# Patient Record
Sex: Female | Born: 1955 | Race: White | Hispanic: No | Marital: Married | Smoking: Former smoker
Health system: Southern US, Community
[De-identification: ages and names within clinical notes are randomized; demographics above are authoritative.]

## PROBLEM LIST (undated history)

## (undated) DIAGNOSIS — M899 Disorder of bone, unspecified: Secondary | ICD-10-CM

## (undated) DIAGNOSIS — J45909 Unspecified asthma, uncomplicated: Secondary | ICD-10-CM

## (undated) DIAGNOSIS — M84376A Stress fracture, unspecified foot, initial encounter for fracture: Secondary | ICD-10-CM

## (undated) DIAGNOSIS — M949 Disorder of cartilage, unspecified: Secondary | ICD-10-CM

## (undated) DIAGNOSIS — F329 Major depressive disorder, single episode, unspecified: Secondary | ICD-10-CM

## (undated) DIAGNOSIS — D8989 Other specified disorders involving the immune mechanism, not elsewhere classified: Secondary | ICD-10-CM

## (undated) DIAGNOSIS — E663 Overweight: Secondary | ICD-10-CM

## (undated) DIAGNOSIS — M810 Age-related osteoporosis without current pathological fracture: Secondary | ICD-10-CM

## (undated) DIAGNOSIS — F411 Generalized anxiety disorder: Secondary | ICD-10-CM

## (undated) DIAGNOSIS — C801 Malignant (primary) neoplasm, unspecified: Secondary | ICD-10-CM

## (undated) DIAGNOSIS — I10 Essential (primary) hypertension: Secondary | ICD-10-CM

## (undated) DIAGNOSIS — I498 Other specified cardiac arrhythmias: Secondary | ICD-10-CM

## (undated) DIAGNOSIS — H409 Unspecified glaucoma: Secondary | ICD-10-CM

## (undated) DIAGNOSIS — M25519 Pain in unspecified shoulder: Secondary | ICD-10-CM

## (undated) DIAGNOSIS — E7800 Pure hypercholesterolemia, unspecified: Secondary | ICD-10-CM

## (undated) DIAGNOSIS — K219 Gastro-esophageal reflux disease without esophagitis: Secondary | ICD-10-CM

## (undated) DIAGNOSIS — T7840XA Allergy, unspecified, initial encounter: Secondary | ICD-10-CM

## (undated) DIAGNOSIS — F3289 Other specified depressive episodes: Secondary | ICD-10-CM

## (undated) DIAGNOSIS — H269 Unspecified cataract: Secondary | ICD-10-CM

## (undated) DIAGNOSIS — K589 Irritable bowel syndrome without diarrhea: Secondary | ICD-10-CM

## (undated) HISTORY — DX: Other specified depressive episodes: F32.89

## (undated) HISTORY — DX: Gastro-esophageal reflux disease without esophagitis: K21.9

## (undated) HISTORY — DX: Age-related osteoporosis without current pathological fracture: M81.0

## (undated) HISTORY — PX: EYE SURGERY: SHX253

## (undated) HISTORY — DX: Other specified disorders involving the immune mechanism, not elsewhere classified: D89.89

## (undated) HISTORY — DX: Overweight: E66.3

## (undated) HISTORY — DX: Unspecified asthma, uncomplicated: J45.909

## (undated) HISTORY — DX: Pain in unspecified shoulder: M25.519

## (undated) HISTORY — DX: Stress fracture, unspecified foot, initial encounter for fracture: M84.376A

## (undated) HISTORY — DX: Unspecified glaucoma: H40.9

## (undated) HISTORY — DX: Pure hypercholesterolemia, unspecified: E78.00

## (undated) HISTORY — DX: Allergy, unspecified, initial encounter: T78.40XA

## (undated) HISTORY — DX: Disorder of bone, unspecified: M89.9

## (undated) HISTORY — DX: Unspecified cataract: H26.9

## (undated) HISTORY — DX: Disorder of cartilage, unspecified: M94.9

## (undated) HISTORY — DX: Other specified cardiac arrhythmias: I49.8

## (undated) HISTORY — DX: Essential (primary) hypertension: I10

## (undated) HISTORY — DX: Generalized anxiety disorder: F41.1

## (undated) HISTORY — DX: Major depressive disorder, single episode, unspecified: F32.9

## (undated) HISTORY — DX: Irritable bowel syndrome, unspecified: K58.9

## (undated) HISTORY — PX: COLONOSCOPY: SHX174

## (undated) HISTORY — DX: Malignant (primary) neoplasm, unspecified: C80.1

---

## 1978-05-11 HISTORY — PX: DILATION AND CURETTAGE OF UTERUS: SHX78

## 1997-06-20 ENCOUNTER — Ambulatory Visit (HOSPITAL_COMMUNITY): Admission: RE | Admit: 1997-06-20 | Discharge: 1997-06-20 | Payer: Self-pay | Admitting: Obstetrics and Gynecology

## 1997-07-25 ENCOUNTER — Ambulatory Visit (HOSPITAL_COMMUNITY): Admission: RE | Admit: 1997-07-25 | Discharge: 1997-07-25 | Payer: Self-pay | Admitting: *Deleted

## 1998-03-21 ENCOUNTER — Other Ambulatory Visit: Admission: RE | Admit: 1998-03-21 | Discharge: 1998-03-21 | Payer: Self-pay | Admitting: Obstetrics & Gynecology

## 1998-08-08 ENCOUNTER — Ambulatory Visit (HOSPITAL_COMMUNITY): Admission: RE | Admit: 1998-08-08 | Discharge: 1998-08-08 | Payer: Self-pay | Admitting: Obstetrics and Gynecology

## 1998-08-22 ENCOUNTER — Ambulatory Visit (HOSPITAL_COMMUNITY): Admission: RE | Admit: 1998-08-22 | Discharge: 1998-08-22 | Payer: Self-pay | Admitting: Obstetrics and Gynecology

## 1998-08-22 ENCOUNTER — Encounter: Payer: Self-pay | Admitting: Obstetrics and Gynecology

## 1999-04-10 ENCOUNTER — Other Ambulatory Visit: Admission: RE | Admit: 1999-04-10 | Discharge: 1999-04-10 | Payer: Self-pay | Admitting: Obstetrics and Gynecology

## 1999-09-03 ENCOUNTER — Encounter: Payer: Self-pay | Admitting: Obstetrics & Gynecology

## 1999-09-03 ENCOUNTER — Encounter: Admission: RE | Admit: 1999-09-03 | Discharge: 1999-09-03 | Payer: Self-pay | Admitting: Obstetrics & Gynecology

## 2000-05-17 ENCOUNTER — Other Ambulatory Visit: Admission: RE | Admit: 2000-05-17 | Discharge: 2000-05-17 | Payer: Self-pay | Admitting: Obstetrics and Gynecology

## 2000-09-30 ENCOUNTER — Encounter: Payer: Self-pay | Admitting: Obstetrics and Gynecology

## 2000-09-30 ENCOUNTER — Ambulatory Visit (HOSPITAL_COMMUNITY): Admission: RE | Admit: 2000-09-30 | Discharge: 2000-09-30 | Payer: Self-pay | Admitting: Obstetrics and Gynecology

## 2001-10-04 ENCOUNTER — Ambulatory Visit (HOSPITAL_COMMUNITY): Admission: RE | Admit: 2001-10-04 | Discharge: 2001-10-04 | Payer: Self-pay | Admitting: Obstetrics and Gynecology

## 2001-10-04 ENCOUNTER — Encounter: Payer: Self-pay | Admitting: Obstetrics and Gynecology

## 2002-04-07 ENCOUNTER — Encounter: Payer: Self-pay | Admitting: Pulmonary Disease

## 2002-04-07 ENCOUNTER — Encounter: Admission: RE | Admit: 2002-04-07 | Discharge: 2002-04-07 | Payer: Self-pay | Admitting: Pulmonary Disease

## 2002-04-28 ENCOUNTER — Encounter: Admission: RE | Admit: 2002-04-28 | Discharge: 2002-04-28 | Payer: Self-pay | Admitting: Pulmonary Disease

## 2002-04-28 ENCOUNTER — Encounter: Payer: Self-pay | Admitting: Pulmonary Disease

## 2003-04-13 ENCOUNTER — Encounter: Admission: RE | Admit: 2003-04-13 | Discharge: 2003-04-13 | Payer: Self-pay | Admitting: Obstetrics and Gynecology

## 2003-05-12 HISTORY — PX: CERVICAL POLYPECTOMY: SHX88

## 2004-03-19 ENCOUNTER — Ambulatory Visit: Payer: Self-pay | Admitting: Pulmonary Disease

## 2004-03-24 ENCOUNTER — Ambulatory Visit: Payer: Self-pay | Admitting: Pulmonary Disease

## 2004-04-16 ENCOUNTER — Other Ambulatory Visit: Admission: RE | Admit: 2004-04-16 | Discharge: 2004-04-16 | Payer: Self-pay | Admitting: Obstetrics and Gynecology

## 2004-04-23 ENCOUNTER — Ambulatory Visit: Payer: Self-pay | Admitting: Pulmonary Disease

## 2004-04-23 ENCOUNTER — Ambulatory Visit (HOSPITAL_COMMUNITY): Admission: RE | Admit: 2004-04-23 | Discharge: 2004-04-23 | Payer: Self-pay | Admitting: Pulmonary Disease

## 2004-05-01 ENCOUNTER — Encounter: Admission: RE | Admit: 2004-05-01 | Discharge: 2004-05-01 | Payer: Self-pay | Admitting: Obstetrics and Gynecology

## 2004-06-12 ENCOUNTER — Ambulatory Visit: Payer: Self-pay | Admitting: Pulmonary Disease

## 2004-08-26 ENCOUNTER — Ambulatory Visit: Payer: Self-pay | Admitting: Pulmonary Disease

## 2004-08-29 ENCOUNTER — Ambulatory Visit (HOSPITAL_COMMUNITY): Admission: RE | Admit: 2004-08-29 | Discharge: 2004-08-29 | Payer: Self-pay | Admitting: Pulmonary Disease

## 2004-10-31 ENCOUNTER — Ambulatory Visit: Payer: Self-pay | Admitting: Pulmonary Disease

## 2005-02-27 ENCOUNTER — Emergency Department (HOSPITAL_COMMUNITY): Admission: EM | Admit: 2005-02-27 | Discharge: 2005-02-27 | Payer: Self-pay | Admitting: Emergency Medicine

## 2005-03-02 ENCOUNTER — Ambulatory Visit: Payer: Self-pay | Admitting: Pulmonary Disease

## 2005-03-04 ENCOUNTER — Ambulatory Visit: Payer: Self-pay | Admitting: Pulmonary Disease

## 2005-03-18 ENCOUNTER — Ambulatory Visit: Payer: Self-pay

## 2005-03-18 ENCOUNTER — Ambulatory Visit: Payer: Self-pay | Admitting: Cardiology

## 2005-03-23 ENCOUNTER — Ambulatory Visit: Payer: Self-pay | Admitting: Pulmonary Disease

## 2005-04-28 ENCOUNTER — Ambulatory Visit: Payer: Self-pay | Admitting: Pulmonary Disease

## 2005-05-13 ENCOUNTER — Ambulatory Visit: Payer: Self-pay | Admitting: Pulmonary Disease

## 2005-05-21 ENCOUNTER — Other Ambulatory Visit: Admission: RE | Admit: 2005-05-21 | Discharge: 2005-05-21 | Payer: Self-pay | Admitting: Obstetrics and Gynecology

## 2005-05-22 ENCOUNTER — Ambulatory Visit: Payer: Self-pay | Admitting: Pulmonary Disease

## 2005-05-28 ENCOUNTER — Encounter: Admission: RE | Admit: 2005-05-28 | Discharge: 2005-05-28 | Payer: Self-pay | Admitting: Obstetrics and Gynecology

## 2005-09-01 ENCOUNTER — Ambulatory Visit: Payer: Self-pay | Admitting: Pulmonary Disease

## 2005-10-23 ENCOUNTER — Encounter: Admission: RE | Admit: 2005-10-23 | Discharge: 2005-10-23 | Payer: Self-pay | Admitting: Gastroenterology

## 2005-11-12 ENCOUNTER — Ambulatory Visit: Payer: Self-pay | Admitting: Pulmonary Disease

## 2005-12-13 ENCOUNTER — Emergency Department (HOSPITAL_COMMUNITY): Admission: EM | Admit: 2005-12-13 | Discharge: 2005-12-13 | Payer: Self-pay | Admitting: Emergency Medicine

## 2006-03-17 ENCOUNTER — Ambulatory Visit: Payer: Self-pay | Admitting: Cardiology

## 2006-06-01 ENCOUNTER — Encounter: Admission: RE | Admit: 2006-06-01 | Discharge: 2006-06-01 | Payer: Self-pay | Admitting: Obstetrics & Gynecology

## 2006-07-07 ENCOUNTER — Ambulatory Visit: Payer: Self-pay | Admitting: Pulmonary Disease

## 2006-07-07 LAB — CONVERTED CEMR LAB
ALT: 24 units/L (ref 0–40)
AST: 19 units/L (ref 0–37)
Albumin: 3.7 g/dL (ref 3.5–5.2)
Alkaline Phosphatase: 73 units/L (ref 39–117)
BUN: 9 mg/dL (ref 6–23)
Basophils Absolute: 0 10*3/uL (ref 0.0–0.1)
Basophils Relative: 0.1 % (ref 0.0–1.0)
Bilirubin Urine: NEGATIVE
Bilirubin, Direct: 0.1 mg/dL (ref 0.0–0.3)
CO2: 26 meq/L (ref 19–32)
Calcium: 9.1 mg/dL (ref 8.4–10.5)
Chloride: 105 meq/L (ref 96–112)
Creatinine, Ser: 0.6 mg/dL (ref 0.4–1.2)
Crystals: NEGATIVE
Eosinophils Absolute: 0.2 10*3/uL (ref 0.0–0.6)
Eosinophils Relative: 1.6 % (ref 0.0–5.0)
GFR calc Af Amer: 136 mL/min
GFR calc non Af Amer: 112 mL/min
Glucose, Bld: 97 mg/dL (ref 70–99)
HCT: 39.2 % (ref 36.0–46.0)
Hemoglobin: 13.3 g/dL (ref 12.0–15.0)
Hgb A1c MFr Bld: 5.8 % (ref 4.6–6.0)
Ketones, ur: NEGATIVE mg/dL
Lymphocytes Relative: 8.6 % — ABNORMAL LOW (ref 12.0–46.0)
MCHC: 33.8 g/dL (ref 30.0–36.0)
MCV: 85.8 fL (ref 78.0–100.0)
Monocytes Absolute: 0.7 10*3/uL (ref 0.2–0.7)
Monocytes Relative: 6.2 % (ref 3.0–11.0)
Mucus, UA: NEGATIVE
Neutro Abs: 8.7 10*3/uL — ABNORMAL HIGH (ref 1.4–7.7)
Neutrophils Relative %: 83.5 % — ABNORMAL HIGH (ref 43.0–77.0)
Nitrite: NEGATIVE
Platelets: 346 10*3/uL (ref 150–400)
Potassium: 3.7 meq/L (ref 3.5–5.1)
RBC / HPF: NONE SEEN
RBC: 4.57 M/uL (ref 3.87–5.11)
RDW: 16.9 % — ABNORMAL HIGH (ref 11.5–14.6)
Sodium: 140 meq/L (ref 135–145)
Specific Gravity, Urine: 1.015 (ref 1.000–1.03)
TSH: 1.28 microintl units/mL (ref 0.35–5.50)
Total Bilirubin: 0.7 mg/dL (ref 0.3–1.2)
Total Protein, Urine: 30 mg/dL — AB
Total Protein: 6.7 g/dL (ref 6.0–8.3)
Urine Glucose: NEGATIVE mg/dL
Urobilinogen, UA: 0.2 (ref 0.0–1.0)
WBC: 10.5 10*3/uL (ref 4.5–10.5)
pH: 6.5 (ref 5.0–8.0)

## 2006-07-12 ENCOUNTER — Ambulatory Visit: Payer: Self-pay | Admitting: Pulmonary Disease

## 2006-07-12 LAB — CONVERTED CEMR LAB
Bilirubin Urine: NEGATIVE
Crystals: NEGATIVE
Ketones, ur: NEGATIVE mg/dL
Mucus, UA: NEGATIVE
Nitrite: NEGATIVE
Specific Gravity, Urine: <=1.005 (ref 1.000–1.03)
Total Protein, Urine: NEGATIVE mg/dL
Urine Glucose: NEGATIVE mg/dL
Urobilinogen, UA: 0.2 (ref 0.0–1.0)
pH: 6.5 (ref 5.0–8.0)

## 2006-07-21 ENCOUNTER — Ambulatory Visit: Payer: Self-pay | Admitting: Pulmonary Disease

## 2006-07-21 LAB — CONVERTED CEMR LAB
Bilirubin Urine: NEGATIVE
Hemoglobin, Urine: NEGATIVE
Ketones, ur: NEGATIVE mg/dL
Leukocytes, UA: NEGATIVE
Nitrite: NEGATIVE
Specific Gravity, Urine: 1.015 (ref 1.000–1.03)
Total Protein, Urine: NEGATIVE mg/dL
Urine Glucose: NEGATIVE mg/dL
Urobilinogen, UA: 0.2 (ref 0.0–1.0)
pH: 7 (ref 5.0–8.0)

## 2006-11-16 ENCOUNTER — Ambulatory Visit: Payer: Self-pay | Admitting: Pulmonary Disease

## 2006-12-14 ENCOUNTER — Ambulatory Visit: Payer: Self-pay | Admitting: Pulmonary Disease

## 2006-12-14 LAB — CONVERTED CEMR LAB
ALT: 32 units/L (ref 0–35)
AST: 24 units/L (ref 0–37)
Albumin: 3.7 g/dL (ref 3.5–5.2)
Alkaline Phosphatase: 63 units/L (ref 39–117)
BUN: 9 mg/dL (ref 6–23)
Bilirubin, Direct: 0.1 mg/dL (ref 0.0–0.3)
CO2: 30 meq/L (ref 19–32)
Calcium: 9.1 mg/dL (ref 8.4–10.5)
Chloride: 106 meq/L (ref 96–112)
Cholesterol: 264 mg/dL (ref 0–200)
Creatinine, Ser: 0.7 mg/dL (ref 0.4–1.2)
Direct LDL: 187.3 mg/dL
GFR calc Af Amer: 113 mL/min
GFR calc non Af Amer: 94 mL/min
Glucose, Bld: 92 mg/dL (ref 70–99)
HDL: 68.7 mg/dL (ref >39.0)
Potassium: 3.5 meq/L (ref 3.5–5.1)
Sodium: 142 meq/L (ref 135–145)
Total Bilirubin: 0.7 mg/dL (ref 0.3–1.2)
Total CHOL/HDL Ratio: 3.8
Total Protein: 6.6 g/dL (ref 6.0–8.3)
Triglycerides: 93 mg/dL (ref 0–149)
VLDL: 19 mg/dL (ref 0–40)

## 2006-12-15 ENCOUNTER — Ambulatory Visit: Payer: Self-pay | Admitting: Pulmonary Disease

## 2007-03-29 ENCOUNTER — Ambulatory Visit: Payer: Self-pay | Admitting: Cardiology

## 2007-04-14 ENCOUNTER — Ambulatory Visit: Payer: Self-pay | Admitting: Pulmonary Disease

## 2007-04-14 LAB — CONVERTED CEMR LAB
ALT: 20 units/L (ref 0–35)
AST: 19 units/L (ref 0–37)
Albumin: 3.5 g/dL (ref 3.5–5.2)
Alkaline Phosphatase: 61 units/L (ref 39–117)
Bilirubin, Direct: 0.1 mg/dL (ref 0.0–0.3)
Cholesterol: 208 mg/dL (ref 0–200)
Direct LDL: 139 mg/dL
HDL: 61 mg/dL (ref >39.0)
Total Bilirubin: 0.6 mg/dL (ref 0.3–1.2)
Total CHOL/HDL Ratio: 3.4
Total Protein: 6.5 g/dL (ref 6.0–8.3)
Triglycerides: 78 mg/dL (ref 0–149)
VLDL: 16 mg/dL (ref 0–40)

## 2007-04-26 DIAGNOSIS — F329 Major depressive disorder, single episode, unspecified: Secondary | ICD-10-CM

## 2007-04-26 DIAGNOSIS — J45909 Unspecified asthma, uncomplicated: Secondary | ICD-10-CM

## 2007-04-26 DIAGNOSIS — I10 Essential (primary) hypertension: Secondary | ICD-10-CM | POA: Insufficient documentation

## 2007-04-26 DIAGNOSIS — F411 Generalized anxiety disorder: Secondary | ICD-10-CM

## 2007-05-13 ENCOUNTER — Telehealth (INDEPENDENT_AMBULATORY_CARE_PROVIDER_SITE_OTHER): Payer: Self-pay | Admitting: *Deleted

## 2007-06-20 ENCOUNTER — Encounter: Payer: Self-pay | Admitting: Pulmonary Disease

## 2007-07-25 ENCOUNTER — Encounter: Admission: RE | Admit: 2007-07-25 | Discharge: 2007-07-25 | Payer: Self-pay | Admitting: Unknown Physician Specialty

## 2008-02-27 ENCOUNTER — Telehealth (INDEPENDENT_AMBULATORY_CARE_PROVIDER_SITE_OTHER): Payer: Self-pay | Admitting: *Deleted

## 2008-05-16 ENCOUNTER — Ambulatory Visit: Payer: Self-pay | Admitting: Pulmonary Disease

## 2008-05-16 DIAGNOSIS — R109 Unspecified abdominal pain: Secondary | ICD-10-CM

## 2008-07-17 ENCOUNTER — Ambulatory Visit: Payer: Self-pay | Admitting: Cardiology

## 2008-08-18 DIAGNOSIS — I498 Other specified cardiac arrhythmias: Secondary | ICD-10-CM

## 2008-09-05 ENCOUNTER — Encounter: Admission: RE | Admit: 2008-09-05 | Discharge: 2008-09-05 | Payer: Self-pay | Admitting: Obstetrics & Gynecology

## 2008-11-19 ENCOUNTER — Telehealth: Payer: Self-pay | Admitting: Cardiology

## 2008-11-22 ENCOUNTER — Telehealth: Payer: Self-pay | Admitting: Pulmonary Disease

## 2008-11-23 ENCOUNTER — Ambulatory Visit: Payer: Self-pay | Admitting: Pulmonary Disease

## 2008-11-23 ENCOUNTER — Telehealth (INDEPENDENT_AMBULATORY_CARE_PROVIDER_SITE_OTHER): Payer: Self-pay | Admitting: *Deleted

## 2008-11-26 ENCOUNTER — Ambulatory Visit: Payer: Self-pay | Admitting: Adult Health

## 2008-11-26 ENCOUNTER — Ambulatory Visit: Payer: Self-pay | Admitting: Pulmonary Disease

## 2008-11-26 DIAGNOSIS — R519 Headache, unspecified: Secondary | ICD-10-CM | POA: Insufficient documentation

## 2008-11-26 DIAGNOSIS — M542 Cervicalgia: Secondary | ICD-10-CM | POA: Insufficient documentation

## 2008-11-26 DIAGNOSIS — R51 Headache: Secondary | ICD-10-CM

## 2008-11-26 LAB — CONVERTED CEMR LAB
ALT: 23 units/L (ref 0–35)
AST: 19 units/L (ref 0–37)
Albumin: 3.8 g/dL (ref 3.5–5.2)
Alkaline Phosphatase: 65 units/L (ref 39–117)
BUN: 13 mg/dL (ref 6–23)
Basophils Absolute: 0 10*3/uL (ref 0.0–0.1)
Basophils Relative: 0.1 % (ref 0.0–3.0)
Bilirubin, Direct: 0.1 mg/dL (ref 0.0–0.3)
CO2: 32 meq/L (ref 19–32)
Calcium: 9.1 mg/dL (ref 8.4–10.5)
Chloride: 103 meq/L (ref 96–112)
Cholesterol: 260 mg/dL — ABNORMAL HIGH (ref 0–200)
Creatinine, Ser: 0.7 mg/dL (ref 0.4–1.2)
Direct LDL: 155.5 mg/dL
Eosinophils Absolute: 0 10*3/uL (ref 0.0–0.7)
Eosinophils Relative: 0.1 % (ref 0.0–5.0)
GFR calc non Af Amer: 92.75 mL/min (ref >60)
Glucose, Bld: 99 mg/dL (ref 70–99)
HCT: 39.7 % (ref 36.0–46.0)
HDL: 96.3 mg/dL (ref >39.00)
Hemoglobin: 13.8 g/dL (ref 12.0–15.0)
Lymphocytes Relative: 11 % — ABNORMAL LOW (ref 12.0–46.0)
Lymphs Abs: 1.1 10*3/uL (ref 0.7–4.0)
MCHC: 34.8 g/dL (ref 30.0–36.0)
MCV: 91.9 fL (ref 78.0–100.0)
Monocytes Absolute: 0.5 10*3/uL (ref 0.1–1.0)
Monocytes Relative: 4.5 % (ref 3.0–12.0)
Neutro Abs: 8.7 10*3/uL — ABNORMAL HIGH (ref 1.4–7.7)
Neutrophils Relative %: 84.3 % — ABNORMAL HIGH (ref 43.0–77.0)
Platelets: 360 10*3/uL (ref 150.0–400.0)
Potassium: 4 meq/L (ref 3.5–5.1)
RBC: 4.33 M/uL (ref 3.87–5.11)
RDW: 13.7 % (ref 11.5–14.6)
Sodium: 143 meq/L (ref 135–145)
TSH: 0.42 microintl units/mL (ref 0.35–5.50)
Total Bilirubin: 0.6 mg/dL (ref 0.3–1.2)
Total CHOL/HDL Ratio: 3
Total Protein: 7.1 g/dL (ref 6.0–8.3)
Triglycerides: 85 mg/dL (ref 0.0–149.0)
VLDL: 17 mg/dL (ref 0.0–40.0)
WBC: 10.3 10*3/uL (ref 4.5–10.5)

## 2008-11-27 ENCOUNTER — Ambulatory Visit: Payer: Self-pay | Admitting: Cardiology

## 2008-11-30 LAB — CONVERTED CEMR LAB: Vit D, 25-Hydroxy: 33 ng/mL (ref 30–89)

## 2008-12-12 ENCOUNTER — Encounter: Payer: Self-pay | Admitting: Adult Health

## 2008-12-12 ENCOUNTER — Ambulatory Visit: Payer: Self-pay | Admitting: Pulmonary Disease

## 2009-01-01 ENCOUNTER — Telehealth (INDEPENDENT_AMBULATORY_CARE_PROVIDER_SITE_OTHER): Payer: Self-pay | Admitting: *Deleted

## 2009-03-25 ENCOUNTER — Ambulatory Visit: Payer: Self-pay | Admitting: Pulmonary Disease

## 2009-03-25 DIAGNOSIS — E663 Overweight: Secondary | ICD-10-CM

## 2009-03-25 DIAGNOSIS — K589 Irritable bowel syndrome without diarrhea: Secondary | ICD-10-CM

## 2009-03-25 DIAGNOSIS — M84376A Stress fracture, unspecified foot, initial encounter for fracture: Secondary | ICD-10-CM | POA: Insufficient documentation

## 2009-03-25 DIAGNOSIS — K219 Gastro-esophageal reflux disease without esophagitis: Secondary | ICD-10-CM | POA: Insufficient documentation

## 2009-03-25 DIAGNOSIS — E7800 Pure hypercholesterolemia, unspecified: Secondary | ICD-10-CM

## 2009-03-25 DIAGNOSIS — M25519 Pain in unspecified shoulder: Secondary | ICD-10-CM

## 2009-03-27 ENCOUNTER — Ambulatory Visit: Payer: Self-pay | Admitting: Pulmonary Disease

## 2009-03-31 LAB — CONVERTED CEMR LAB
Cholesterol: 208 mg/dL — ABNORMAL HIGH (ref 0–200)
Direct LDL: 129.8 mg/dL
HDL: 79.9 mg/dL (ref >39.00)
Total CHOL/HDL Ratio: 3
Triglycerides: 89 mg/dL (ref 0.0–149.0)
VLDL: 17.8 mg/dL (ref 0.0–40.0)

## 2009-09-23 ENCOUNTER — Ambulatory Visit: Payer: Self-pay | Admitting: Pulmonary Disease

## 2009-09-23 DIAGNOSIS — M949 Disorder of cartilage, unspecified: Secondary | ICD-10-CM

## 2009-09-23 DIAGNOSIS — M899 Disorder of bone, unspecified: Secondary | ICD-10-CM | POA: Insufficient documentation

## 2009-09-24 ENCOUNTER — Ambulatory Visit: Payer: Self-pay | Admitting: Pulmonary Disease

## 2009-09-29 LAB — CONVERTED CEMR LAB
ALT: 27 units/L (ref 0–35)
AST: 20 units/L (ref 0–37)
Albumin: 3.7 g/dL (ref 3.5–5.2)
Alkaline Phosphatase: 68 units/L (ref 39–117)
Basophils Absolute: 0 10*3/uL (ref 0.0–0.1)
Basophils Relative: 0.7 % (ref 0.0–3.0)
Bilirubin, Direct: 0 mg/dL (ref 0.0–0.3)
CO2: 30 meq/L (ref 19–32)
Calcium: 9.1 mg/dL (ref 8.4–10.5)
Creatinine, Ser: 0.7 mg/dL (ref 0.4–1.2)
Direct LDL: 138.1 mg/dL
Eosinophils Absolute: 0.4 10*3/uL (ref 0.0–0.7)
Eosinophils Relative: 6.7 % — ABNORMAL HIGH (ref 0.0–5.0)
GFR calc non Af Amer: 100.72 mL/min (ref >60)
Glucose, Bld: 84 mg/dL (ref 70–99)
HCT: 37.8 % (ref 36.0–46.0)
HDL: 83.8 mg/dL (ref >39.00)
Hemoglobin: 13.1 g/dL (ref 12.0–15.0)
Lymphocytes Relative: 23.4 % (ref 12.0–46.0)
Lymphs Abs: 1.5 10*3/uL (ref 0.7–4.0)
MCHC: 34.7 g/dL (ref 30.0–36.0)
MCV: 93 fL (ref 78.0–100.0)
Monocytes Absolute: 0.4 10*3/uL (ref 0.1–1.0)
Neutrophils Relative %: 63.4 % (ref 43.0–77.0)
Platelets: 309 10*3/uL (ref 150.0–400.0)
Potassium: 4 meq/L (ref 3.5–5.1)
RBC: 4.07 M/uL (ref 3.87–5.11)
RDW: 14.3 % (ref 11.5–14.6)
Sodium: 142 meq/L (ref 135–145)
TSH: 1.53 microintl units/mL (ref 0.35–5.50)
Total Bilirubin: 0.3 mg/dL (ref 0.3–1.2)
Total CHOL/HDL Ratio: 3
Total Protein: 6.4 g/dL (ref 6.0–8.3)
Triglycerides: 119 mg/dL (ref 0.0–149.0)
VLDL: 23.8 mg/dL (ref 0.0–40.0)
WBC: 6.6 10*3/uL (ref 4.5–10.5)

## 2010-02-13 ENCOUNTER — Telehealth (INDEPENDENT_AMBULATORY_CARE_PROVIDER_SITE_OTHER): Payer: Self-pay | Admitting: *Deleted

## 2010-03-25 ENCOUNTER — Ambulatory Visit: Payer: Self-pay | Admitting: Pulmonary Disease

## 2010-03-26 ENCOUNTER — Ambulatory Visit: Payer: Self-pay | Admitting: Pulmonary Disease

## 2010-03-30 LAB — CONVERTED CEMR LAB
Cholesterol: 233 mg/dL — ABNORMAL HIGH (ref 0–200)
Direct LDL: 136.1 mg/dL
HDL: 88.1 mg/dL (ref >39.00)
Total CHOL/HDL Ratio: 3
Triglycerides: 128 mg/dL (ref 0.0–149.0)
VLDL: 25.6 mg/dL (ref 0.0–40.0)

## 2010-06-10 NOTE — Assessment & Plan Note (Signed)
Summary: 6 month return//mhh   CC:  6 month ROV & review of mult medical problems....  History of Present Illness: Monique Weber here for a follow up visit and CPX... she has multiple medical problems as noted below...     ~  Sep 23, 2009:  she's had a good 49mo- had some right chest wall trauma from a gate- pain & bruise, now resolved w/o residual... she has decr her Simvastatin to 40mg /d per her insurance company- problem is her last FLP wasn't at goal on 80mg /d & we discussed checking FLP on 40mg  before deciding on changing to another statin...   ~  March 26, 2010:  she's been stable over the last 49mo- no new complaints or concerns... her breathing has been doing well on Advair & Proair;  BP controlled on 4 med regimen below & no palpit etc;  Chol is not at goal on Simva40 & we discussed switch to Lipitor40mg /d;  she needs to work on weight reduction;  still under alot of stress but doing satis on Zoloft & Alpraz...     Current Problems:   ASTHMA (ICD-493.90) - on ADVAIR 250 Bid & PROAIR HFA Prn... prev on Accolate but was only taking it 1-2 per week therefore discontinued... hx asthmatic bronchitis w/o exac x yrs although incr difficulty in the spring... currently doing well- denies cough, sputum, hemoptysis, worsening dyspnea,  wheezing, chest pains, snoring, daytime hypersomnolence, etc...   ~  CXR 11/10 showed borderline cardiomeg, clear lungs, sm left diaph defect...  HYPERTENSION (ICD-401.9) - on ASA 81mg /d,  LABETOLOL 200Bid, DILTIAZEM ER 360mg /d, LISINOPRIL 40mg /d, & LASIX 20mg /d... takes meds regularly & tol well... BP = 122/74 & similar at home... denies HA, fatigue, visual changes, CP, palipit, dizziness, syncope, dyspnea, edema, etc...   ~  baseline EKG shows NSR, WNL...  ~  2DEcho 11/06 was normal- EF= 55-65% & no regional wall motion abn.  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 no recent CP, palpit,  etc...  HYPERCHOLESTEROLEMIA (ICD-272.0) - on SIMVASTATIN 40mg Qhs + FishOil...   ~  FLP 8/08 on Vytor10/40 showed TChol 264, TG 93, HDL 69, LDL 187... change to Simva80.  ~  FLP 12/08 on Simva80 showed TChol 208, TG 78, HDL 61, LDL 139... continue med, better diet.  ~  FLP 7/10 off med showed TChol 260, TG 85, HDL 96, LDL 156... rec> restart Simva80.  ~  FLP 11/10 on Simva80 showed TChol 208, TG 89, HDL 80, LDL 130... she decr to Simva40 per Insurance Co.  ~  FLP 5/11 on Simva40 showed TChol 227, TG 119, HDL 84, LDL 138  ~  FLP 11/11 on Simva40 showed TChol 233, TG 128, HDL 88, LDL 136... rec> ch to LIPITOR 40mg /d.  OVERWEIGHT (ICD-278.02) - we reviewed diet + exercise program necessary to lose weight.  ~  weight 8/08 = 185#  ~  weight 7/10 = 201#  ~  weight 11/10 = 185#  ~  weight 5/11 = 187#  ~  weight 11/11 = 191#  Hx of GERD (ICD-530.81) - uses PRILOSEC OTC 20mg  Prn.  IRRITABLE BOWEL SYNDROME (ICD-564.1) - prev followed for GI by DrJohnson but she switched to [facility]...  ~  colonoscopy 5/07 by DrJohnson was WNL.Marland Kitchen. she saw DrNat-Mann after that for post-colonoscopy pain & dx w/ IBS...  SHOULDER PAIN, BILATERAL (ICD-719.41) & STRESS FRACTURE, FOOT (ICD-733.94) - she's had right rotator cuff tear and eval by Ortho, then fall w/ injury  to left shoulder... she sees chiropractor Prn w/ good relief of symptoms.... s/p bilat stress fractures of the feet.  ANXIETY (ICD-300.00) & DEPRESSION (ICD-311) - on ALPRAZOLAM 0.5mg  Tid, ZOLOFT 100mg - 2Qam, & TEMAZEPAM 30mg Qhs... eval by DrMcKinney for Psychiatry & prev counselling as well...  Family Hx of MELANOMA (ICD-172.9) - both parents have hx malig melanoma removed- pt sees Derm Q68mo...  Health Maintenance:  GYN prev DrCollins, now DrNeal- on Premarin 0.625/ Prometrium (x12d every 3 mo)... also takes Calcium/ MVI/ Vit D 2000 u daily... she reports BMD in 2009 [facility]) w/ Osteopenia...   Preventive Screening-Counseling &  Management  Comments: quit off and on since 1991  Allergies (verified): No Known Drug Allergies  Comments:  Nurse/Medical Assistant: The patient's medications and allergies were reviewed with the patient and were updated in the Medication and Allergy Lists.  Past History:  Past Medical History: ASTHMA (ICD-493.90) HYPERTENSION (ICD-401.9) SUPRAVENTRICULAR TACHYCARDIA (ICD-427.89) HYPERCHOLESTEROLEMIA (ICD-272.0) OVERWEIGHT (ICD-278.02) Hx of GERD (ICD-530.81) IRRITABLE BOWEL SYNDROME (ICD-564.1) SHOULDER PAIN, BILATERAL (ICD-719.41) STRESS FRACTURE, FOOT (ICD-733.94) OSTEOPENIA (ICD-733.90) ANXIETY (ICD-300.00) DEPRESSION (ICD-311) Family Hx of MELANOMA (ICD-172.9)  Past Surgical History: D&C...miscarriage..1980 Cervical polyps removed 2005  Family History: Reviewed history from 03/25/2009 and no changes required. Father alive, age 16 w/ hx heart dis, Chol, Melanoma. Mother alive, age 3 w/ hx HBP, Chol, DJD, Melanoma. No Siblings...  **NOTE: her mother was Dx w/ Hashimoto's Encephalopathy at [facility] in [place], [place].  Social History: Reviewed history from 03/25/2009 and no changes required. 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  Review of Systems      See HPI  Vital Signs:  Patient profile:   [age] year old female Height:      67 inches Weight:      191 pounds BMI:     30.02 O2 Sat:      98 % on Room air Temp:     98.6 degrees F oral Pulse rate:   77 / minute BP sitting:   134 / 80  (left arm) Cuff size:   regular  Vitals Entered By: Randell Loop CMA (March 26, 2010 2:00 PM)  O2 Sat at Rest %:  98 O2 Flow:  Room air CC: 6 month ROV & review of mult medical problems... Is Patient Diabetic? No Pain Assessment Patient in pain? no      Comments meds updated today with  pt   Physical Exam  Additional Exam:  WD, Overweight, 55 y/o Weber in NAD... GENERAL:  Alert & oriented; pleasant & cooperative. HEENT:  [place]/AT, EOM-wnl, PERRLA, Fundi-benign, EACs-clear, TMs-wnl, NOSE-clear, THROAT-clear & wnl. NECK:  Supple w/ full ROM; no JVD; normal carotid impulses w/o bruits; no thyromegaly or nodules palpated; no lymphadenopathy. CHEST:  Clear to P & A; without wheezes/ rales/ or rhonchi. HEART:  Regular Rhythm; without murmurs/ rubs/ or gallops. ABDOMEN:  Soft & nontender; normal bowel sounds; no organomegaly or masses detected. EXT: without deformities or arthritic changes; no varicose veins/ venous insuffic/ or edema. NEURO:  CN's intact; motor testing normal; sensory testing normal; gait normal & balance OK. DERM:  No lesions noted; no rash etc...    MISC. Report  Procedure date:  03/25/2010  Findings:      Lipid Panel (LIPID)   Cholesterol          [H]  233 mg/dL  0-200   Triglycerides             128.0 mg/dL                 9.6-295.2   HDL                       84.13 mg/dL                 >24.40  Cholesterol LDL - Direct                             136.1 mg/dL   Impression & Recommendations:  Problem # 1:  ASTHMA (ICD-493.90) Stable on inhalers>  continue same. Her updated medication list for this problem includes:    Advair Diskus 250-50 Mcg/dose Aepb (Fluticasone-salmeterol) ..... Inhale 1 puff two times a day. rinse mouth out after each use.    Proair Hfa 108 (90 Base) Mcg/act Aers (Albuterol sulfate) .Marland Kitchen... 2 sprays every 6 h as needed for wheezing...  Problem # 2:  HYPERTENSION (ICD-401.9) Controlled on meds>  continue same. Her updated medication list for this problem includes:    Labetalol Hcl 200 Mg Tabs (Labetalol hcl) .Marland Kitchen... Take one tablet by mouth two times a day    Taztia Xt 360 Mg Xr24h-cap (Diltiazem hcl er beads) .Marland Kitchen... Take 1 capsule by mouth once a day    Lisinopril 40 Mg Tabs (Lisinopril) .Marland Kitchen... Take 1 by mouth once  daily    Lasix 20 Mg Tabs (Furosemide) .Marland Kitchen... Take 1 tab by mouth each morning...  Problem # 3:  HYPERCHOLESTEROLEMIA (ICD-272.0) We decided to change the Simva to Lipitor 40mg /d... Her updated medication list for this problem includes:    Lipitor 40 Mg Tabs (Atorvastatin calcium) .Marland Kitchen... Take 1 tab by mouth once daily...  Problem # 4:  OVERWEIGHT (ICD-278.02) We discussed diet + exercise...  Problem # 5:  IRRITABLE BOWEL SYNDROME (ICD-564.1) GI is stable>  continue same Rx...  Problem # 6:  ANXIETY (ICD-300.00) Stable on meds>  continue same. Her updated medication list for this problem includes:    Sertraline Hcl 100 Mg Tabs (Sertraline hcl) .Marland Kitchen... Take 2 tabs by mouth each am as directed by psychiatry...    Alprazolam 0.5 Mg Tb24 (Alprazolam) .Marland Kitchen... Take 1/2 to 1 tablet three times a day as needed  Complete Medication List: 1)  Advair Diskus 250-50 Mcg/dose Aepb (Fluticasone-salmeterol) .... Inhale 1 puff two times a day. rinse mouth out after each use. 2)  Proair Hfa 108 (90 Base) Mcg/act Aers (Albuterol sulfate) .... 2 sprays every 6 h as needed for wheezing... 3)  Adult Aspirin Ec Low Strength 81 Mg Tbec (Aspirin) .... Take 1 tablet by mouth once a day 4)  Labetalol Hcl 200 Mg Tabs (Labetalol hcl) .... Take one tablet by mouth two times a day 5)  Taztia Xt 360 Mg Xr24h-cap (Diltiazem hcl er beads) .... Take 1 capsule by mouth once a day 6)  Lisinopril 40 Mg Tabs (Lisinopril) .... Take 1 by mouth once daily 7)  Lasix 20 Mg Tabs (Furosemide) .... Take 1 tab by mouth each morning... 8)  Lipitor 40 Mg Tabs (Atorvastatin calcium) .... Take 1 tab by mouth once daily.Marland KitchenMarland Kitchen 9)  Fish Oil 1000 Mg Caps (Omega-3 fatty acids) .... Take 1 capsule by mouth once a day 10)  Premarin 0.625 Mg Tabs (Estrogens conjugated) .... Take 1 tablet by mouth once a day 11)  Calcium Carbonate-vitamin D 600-400 Mg-unit Tabs (Calcium carbonate-vitamin d) .... Take 1 tablet by mouth two times a day 12)  Multivitamins  Tabs (Multiple vitamin) .... Take 1 tablet by mouth once a day 13)  Vitamin D 2000 Unit Tabs (Cholecalciferol) .... Take 1 tablet by mouth once a day 14)  Ibuprofen 200 Mg Tabs (Ibuprofen) .... As needed for pain 15)  Sertraline Hcl 100 Mg Tabs (Sertraline hcl) .... Take 2 tabs by mouth each am as directed by psychiatry... 16)  Alprazolam 0.5 Mg Tb24 (Alprazolam) .... Take 1/2 to 1 tablet three times a day as needed 17)  Temazepam 30 Mg Caps (Temazepam) .... Take 1 capsule by mouth once a day 18)  Prometrium 100 Mg Caps (Progesterone micronized) .... Take once every 3 months for 12 days  Other Orders: Admin 1st Vaccine (32202) Flu Vaccine 69yrs + (54270)  Patient Instructions: 1)  Today we updated your med list- see below.... 2)  We refilled your meds per request... 3)  We reviewed your recent FLP & decided to switch to Lipitor 40mg /d.Marland KitchenMarland Kitchen 4)  Call for any problems.Marland KitchenMarland Kitchen 5)  Please schedule a follow-up appointment in 6 months. Prescriptions: ADVAIR DISKUS 250-50 MCG/DOSE AEPB (FLUTICASONE-SALMETEROL) Inhale 1 puff two times a day. Rinse mouth out after each use.  #1 x 12   Entered and Authorized by:   Michele Mcalpine MD   Signed by:   Michele Mcalpine MD on 03/26/2010   Method used:   Print then Give to Patient   RxID:   [id number] LIPITOR 40 MG TABS (ATORVASTATIN CALCIUM) take 1 tab by mouth once daily...  #90 x 4   Entered and Authorized by:   Michele Mcalpine MD   Signed by:   Michele Mcalpine MD on 03/26/2010   Method used:   Print then Give to Patient   RxID:   [id number] LASIX 20 MG TABS (FUROSEMIDE) take 1 tab by mouth each morning...  #90 x 4   Entered and Authorized by:   Michele Mcalpine MD   Signed by:   Michele Mcalpine MD on 03/26/2010   Method used:   Print then Give to Patient   RxID:   [id number] LISINOPRIL 40 MG  TABS (LISINOPRIL) Take 1 by mouth once daily  #90 x 4   Entered and Authorized by:   Michele Mcalpine MD   Signed by:   Michele Mcalpine MD on 03/26/2010   Method  used:   Print then Give to Patient   RxID:   [id number] TAZTIA XT 360 MG XR24H-CAP (DILTIAZEM HCL ER BEADS) Take 1 capsule by mouth once a day  #90 x 4   Entered and Authorized by:   Michele Mcalpine MD   Signed by:   Michele Mcalpine MD on 03/26/2010   Method used:   Print then Give to Patient   RxID:   [id number] LABETALOL HCL 200 MG  TABS (LABETALOL HCL) take one tablet by mouth two times a day  #180 x 4   Entered and Authorized by:   Michele Mcalpine MD   Signed by:   Michele Mcalpine MD on 03/26/2010   Method used:   Print then Give to Patient   RxID:   [id number] PROAIR HFA 108 (90 BASE) MCG/ACT AERS (ALBUTEROL SULFATE) 2 sprays every 6 H as needed for wheezing...  #1 x 12   Entered and Authorized by:   Michele Mcalpine MD   Signed by:  Michele Mcalpine MD on 03/26/2010   Method used:   Print then Give to Patient   RxID:   [id number]    Immunization History:  Influenza Immunization History:    Influenza:  historical (02/19/2009)  Flu Vaccine Consent Questions     Do you have a history of severe allergic reactions to this vaccine? no    Any prior history of allergic reactions to egg and/or gelatin? no    Do you have a sensitivity to the preservative Thimersol? no    Do you have a past history of Guillan-Barre Syndrome? no    Do you currently have an acute febrile illness? no    Have you ever had a severe reaction to latex? no    Vaccine information given and explained to patient? yes    Are you currently pregnant? no    Lot Number:[id number]   Exp Date:11/08/2010   Site Given  Left Deltoid [id number]   Randell Loop [facility]  March 26, 2010 2:55 PM

## 2010-06-10 NOTE — Assessment & Plan Note (Signed)
Summary: rov 6 month///kp   CC:  6 month ROV & review of mult medical problems....  History of Present Illness: 55 y/o WF here for a follow up visit and CPX... she has multiple medical problems as noted below...     ~  March 25, 2009:  first visit since 8/08... she is followed by DrWall for Cards w/ hx SVT & HBP- controlled on meds below... also sees DrNeal for Gyn (prev DrCollins) on Premarin/ Prometrium... she has been under a good bit of stress but is coping satis & has no new complaints or concerns at present...   ~  Sep 23, 2009:  she's had a good 74mo- had some right chest wall trauma from a gate- pain & bruise, now resolved w/o residual... she has decr her Simvastatin to 40mg /d per her insurance company- problem is her last FLP wasn't at goal on 80mg /d & we discussed checking FLP on 40mg  before deciding on changing to another statin... see prob list below:    Current Problems:   ASTHMA (ICD-493.90) - on PROVENTIL HFA Prn & uses irregularly... prev on Accolate but was only taking it 1-2 per week therefore discontinued... she stopped Advair on her own in 2009... hx asthmatic bronchitis w/o exac x yrs although incr difficulty in the spring... currently doing well- denies cough, sputum, hemoptysis, worsening dyspnea,  wheezing, chest pains, snoring, daytime hypersomnolence, etc...   ~  CXR 11/10 showed borderline cardiomeg, clear lungs, sm left diaph defect...  HYPERTENSION (ICD-401.9) - on LABETOLOL 200Bid, DILTIAZEM ER 360mg /d, LISINOPRIL 40mg /d, & LASIX 20mg /d... takes meds regularly & tol well... BP = 122/74 & similar at home... denies HA, fatigue, visual changes, CP, palipit, dizziness, syncope, dyspnea, edema, etc...   ~  baseline EKG shows NSR, WNL...  ~  2DEcho 11/06 was normal- EF= 55-65% & no regional wall motion abn.  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 no recent CP, palpit, etc...  HYPERCHOLESTEROLEMIA  (ICD-272.0) - on SIMVASTATIN 40mg Qhs + FishOil...   ~  FLP 8/08 on Vytor10/40 showed TChol 264, TG 93, HDL 69, LDL 187... change to Simva80.  ~  FLP 12/08 on Simva80 showed TChol 208, TG 78, HDL 61, LDL 139... continue med, better diet.  ~  FLP 7/10 off med showed TChol 260, TG 85, HDL 96, LDL 156... rec> restart Simva80.  ~  FLP 11/10 on Simva80 showed TChol 208, TG 89, HDL 80, LDL 130... she decr to Simva40 per Insurance Co.  ~  FLP 5/11 on Simva40 showed =   OVERWEIGHT (ICD-278.02) - we reviewed diet + exercise program necessary to lose weight.  ~  weight 8/08 = 185#  ~  weight 7/10 = 201#  ~  weight 11/10 = 185#  ~  weight 5/11 = 187#  Hx of GERD (ICD-530.81) - uses PRILOSEC OTC 20mg  Prn...  IRRITABLE BOWEL SYNDROME (ICD-564.1) - prev followed for GI by DrJohnson but she switched to [facility]...  ~  colonoscopy 5/07 by DrJohnson was WNL.Marland Kitchen. she saw DrNat-Mann after that for post-colonoscopy pain & dx w/ IBS...  SHOULDER PAIN, BILATERAL (ICD-719.41) & STRESS FRACTURE, FOOT (ICD-733.94) - she's had right rotator cuff tear and eval by Ortho, then fall w/ injury to left shoulder... she sees chiropractor Prn w/ good relief of symptoms.... s/p bilat stress fractures of the feet.  ANXIETY (ICD-300.00) & DEPRESSION (ICD-311) - on ALPRAZOLAM 0.5mg  Tid, ZOLOFT 100mg - 2Qam, & TEMAZEPAM 30mg Qhs... eval by DrMcKinney for Psychiatry & prev counselling  as well...  Family Hx of MELANOMA (ICD-172.9) - both parents have hx malig melanoma removed- pt sees Derm Q18mo...  Health Maintenance:  GYN prev DrCollins, now DrNeal- on Premarin 0.625/ Prometrium (x12d every 3 mo)... also takes Calcium/ MVI/ Vit D 2000 u daily... she reports BMD in 2009 [facility]) w/ Osteopenia...   Allergies (verified): No Known Drug Allergies  Comments:  Nurse/Medical Assistant: The patient's medications and allergies were reviewed with the patient and were updated in the Medication and Allergy Lists.  Past History:  Past  Medical History: ASTHMA (ICD-493.90) HYPERTENSION (ICD-401.9) SUPRAVENTRICULAR TACHYCARDIA (ICD-427.89) HYPERCHOLESTEROLEMIA (ICD-272.0) OVERWEIGHT (ICD-278.02) Hx of GERD (ICD-530.81) IRRITABLE BOWEL SYNDROME (ICD-564.1) SHOULDER PAIN, BILATERAL (ICD-719.41) STRESS FRACTURE, FOOT (ICD-733.94) OSTEOPENIA (ICD-733.90) ANXIETY (ICD-300.00) DEPRESSION (ICD-311) Family Hx of MELANOMA (ICD-172.9)  Past Surgical History: D&C...miscarriage..1980 Cervical polyps removed 2005  Family History: Reviewed history from 03/25/2009 and no changes required. Father alive, age 71 w/ hx heart dis, Chol, Melanoma. Mother alive, age 29 w/ hx HBP, Chol, DJD, Melanoma. No Siblings...  Social History: Reviewed history from 03/25/2009 and no changes required. 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  Review of Systems      See HPI  The patient denies anorexia, fever, weight loss, weight gain, vision loss, decreased hearing, hoarseness, chest pain, syncope, dyspnea on exertion, peripheral edema, prolonged cough, headaches, hemoptysis, abdominal pain, melena, hematochezia, severe indigestion/heartburn, hematuria, incontinence, muscle weakness, suspicious skin lesions, transient blindness, difficulty walking, depression, unusual weight change, abnormal bleeding, enlarged lymph nodes, and angioedema.    Vital Signs:  Patient profile:   55 year old female Height:      67 inches Weight:      187 pounds BMI:     29.39 O2 Sat:      98 % on Room air Temp:     97.2 degrees F oral Pulse rate:   75 / minute BP sitting:   122 / 74  (right arm) Cuff size:   regular  Vitals Entered By: Randell Loop CMA (Sep 23, 2009 12:05 PM)  O2 Sat at Rest %:  98 O2 Flow:  Room air CC: 6 month ROV & review of mult medical problems... Is Patient Diabetic? No Pain Assessment Patient in pain?  no      Comments MEDS UPDATED TODAY   Physical Exam  Additional Exam:  WD, Overweight, 55 y/o WF in NAD... GENERAL:  Alert & oriented; pleasant & cooperative. HEENT:  [place]/AT, EOM-wnl, PERRLA, Fundi-benign, EACs-clear, TMs-wnl, NOSE-clear, THROAT-clear & wnl. NECK:  Supple w/ full ROM; no JVD; normal carotid impulses w/o bruits; no thyromegaly or nodules palpated; no lymphadenopathy. CHEST:  Clear to P & A; without wheezes/ rales/ or rhonchi. HEART:  Regular Rhythm; without murmurs/ rubs/ or gallops. ABDOMEN:  Soft & nontender; normal bowel sounds; no organomegaly or masses detected. EXT: without deformities or arthritic changes; no varicose veins/ venous insuffic/ or edema. NEURO:  CN's intact; motor testing normal; sensory testing normal; gait normal & balance OK. DERM:  No lesions noted; no rash etc...    MISC. Report  Procedure date:  09/23/2009  Findings:      She will return to our lab one morning this week for FASTING blood work... we will review & notify pt w/ results & any med changes... SN   Impression & Recommendations:  Problem # 1:  ASTHMA (ICD-493.90) She's noted sl incr use of  Proventil but still does not want to restart Advair... we discussed this today. Her updated medication list for this problem includes:    Proventil Hfa 108 (90 Base) Mcg/act Aers (Albuterol sulfate) ..... Inhale 2 puffs every four hours as needed  Problem # 2:  HYPERTENSION (ICD-401.9) Controlled-  same meds. Her updated medication list for this problem includes:    Labetalol Hcl 200 Mg Tabs (Labetalol hcl) .Marland Kitchen... Take one tablet by mouth two times a day    Taztia Xt 360 Mg Xr24h-cap (Diltiazem hcl er beads) .Marland Kitchen... Take 1 capsule by mouth once a day    Lisinopril 40 Mg Tabs (Lisinopril) .Marland Kitchen... Take 1 by mouth once daily    Lasix 20 Mg Tabs (Furosemide) .Marland Kitchen... Take 1 tab by mouth each morning...  Problem # 3:  SUPRAVENTRICULAR TACHYCARDIA (ICD-427.89) Stable>  she knows to avoid caffeine,  etc... Her updated medication list for this problem includes:    Adult Aspirin Ec Low Strength 81 Mg Tbec (Aspirin) .Marland Kitchen... Take 1 tablet by mouth once a day    Labetalol Hcl 200 Mg Tabs (Labetalol hcl) .Marland Kitchen... Take one tablet by mouth two times a day  Problem # 4:  HYPERCHOLESTEROLEMIA (ICD-272.0) She has decr the Simvastatin to 40mg  Qhs... we will check FLP & see if she needs change to a stronger med like Crestor. Her updated medication list for this problem includes:    Simvastatin 40 Mg Tabs (Simvastatin) .Marland Kitchen... Take one tablet by mouth at bedtime  Problem # 5:  OVERWEIGHT (ICD-278.02) We discussed diet + exercise>  get weight down...  Problem # 6:  Hx of GERD (ICD-530.81) GI is stable>  no new complaints or concerns...  Problem # 7:  OSTEOPENIA (ICD-733.90) Followed by GYN= DrNeal w/ BMD in 2009 as noted...  Problem # 8:  DEPRESSION (ICD-311) Followed by Psychiatry- DrMcKinney & Valinda Hoar on Zoloft + Alpraz Prn... Her updated medication list for this problem includes:    Sertraline Hcl 100 Mg Tabs (Sertraline hcl) .Marland Kitchen... Take 2 tabs by mouth each am as directed by psychiatry...    Alprazolam 0.5 Mg Tb24 (Alprazolam) .Marland Kitchen... Take 1/2 to 1 tablet three times a day as needed  Complete Medication List: 1)  Proventil Hfa 108 (90 Base) Mcg/act Aers (Albuterol sulfate) .... Inhale 2 puffs every four hours as needed 2)  Adult Aspirin Ec Low Strength 81 Mg Tbec (Aspirin) .... Take 1 tablet by mouth once a day 3)  Labetalol Hcl 200 Mg Tabs (Labetalol hcl) .... Take one tablet by mouth two times a day 4)  Taztia Xt 360 Mg Xr24h-cap (Diltiazem hcl er beads) .... Take 1 capsule by mouth once a day 5)  Lisinopril 40 Mg Tabs (Lisinopril) .... Take 1 by mouth once daily 6)  Lasix 20 Mg Tabs (Furosemide) .... Take 1 tab by mouth each morning... 7)  Simvastatin 40 Mg Tabs (Simvastatin) .... Take one tablet by mouth at bedtime 8)  Fish Oil 1000 Mg Caps (Omega-3 fatty acids) .... Take 1 capsule by  mouth once a day 9)  Premarin 0.625 Mg Tabs (Estrogens conjugated) .... Take 1 tablet by mouth once a day 10)  Calcium Carbonate-vitamin D 600-400 Mg-unit Tabs (Calcium carbonate-vitamin d) .... Take 1 tablet by mouth two times a day 11)  Multivitamins Tabs (Multiple vitamin) .... Take 1 tablet by mouth once a day 12)  Vitamin D 2000 Unit Tabs (Cholecalciferol) .... Take 1 tablet by mouth once a day 13)  Hydrocodone-acetaminophen 7.5-500 Mg Tabs (Hydrocodone-acetaminophen) .Marland Kitchen.. 1-2  by mouth every 4-6 hr as needed pain, may make you sleepy. 14)  Sertraline Hcl 100 Mg Tabs (Sertraline hcl) .... Take 2 tabs by mouth each am as directed by psychiatry... 15)  Alprazolam 0.5 Mg Tb24 (Alprazolam) .... Take 1/2 to 1 tablet three times a day as needed 16)  Temazepam 30 Mg Caps (Temazepam) .... Take 1 capsule by mouth once a day  Patient Instructions: 1)  Today we updated your med list- see below.... 2)  Continue your current meds the same... 3)  Please return to our lab one morning this week for your FASTING blood work... 4)  Then call the "phone tree" in a few days for your lab results.Marland KitchenMarland Kitchen  5)  Continue your low carb/ low fat diet & exercise program... 6)  Call for any problems.Marland KitchenMarland Kitchen 7)  Please schedule a follow-up appointment in 6 months.

## 2010-06-10 NOTE — Progress Notes (Signed)
Summary: go back on advair?----rx sent  Phone Note Call from Patient   Caller: Patient Call For: nadel Summary of Call: need refill for advair . she have not had it in a while [facility] Initial call taken by: Rickard Patience,  February 13, 2010 10:34 AM  Follow-up for Phone Call        Pt states she has astham but has not had any problems with ituntil recently with the change of seasons. She states she ahs been havingsome increased wheezing and cough. She has had to use her rescue inhaler more oftenthenusual as well and would liek to go backon Advair 250-50. Pt staets she has restarted on accolate and it has helped some. Please advise ok fro pt to go back on Advair 250-50. Carron Curie CMA  February 13, 2010 11:17 AM   Additional Follow-up for Phone Call Additional follow up Details #1::        per SN----ok for advair 250/50  1 inhalation two times a day and rinse after each use...refill x 11.  thanks Randell Loop CMA  February 13, 2010 11:54 AM     Additional Follow-up for Phone Call Additional follow up Details #2::    [facility] informing pt rx sent to pharmacy.  Aundra Millet Reynolds LPN  February 13, 2010 11:57 AM   New/Updated Medications: ADVAIR DISKUS 250-50 MCG/DOSE AEPB (FLUTICASONE-SALMETEROL) Inhale 1 puff two times a day. Rinse mouth out after each use. Prescriptions: ADVAIR DISKUS 250-50 MCG/DOSE AEPB (FLUTICASONE-SALMETEROL) Inhale 1 puff two times a day. Rinse mouth out after each use.  #60 x 11   Entered by:   Arman Filter LPN   Authorized by:   Michele Mcalpine MD   Signed by:   Arman Filter LPN on 04/54/0981   Method used:   Electronically to        [facility]. [phone]* (retail)       [address]       [place], [place]  [address]       Ph: [phone]       Fax: [phone]   RxID:   [id number]

## 2010-07-14 ENCOUNTER — Telehealth: Payer: Self-pay | Admitting: Pulmonary Disease

## 2010-07-15 ENCOUNTER — Encounter: Payer: Self-pay | Admitting: Cardiology

## 2010-07-15 ENCOUNTER — Ambulatory Visit (INDEPENDENT_AMBULATORY_CARE_PROVIDER_SITE_OTHER): Payer: BC Managed Care – PPO | Admitting: Cardiology

## 2010-07-15 DIAGNOSIS — R072 Precordial pain: Secondary | ICD-10-CM | POA: Insufficient documentation

## 2010-07-21 ENCOUNTER — Encounter (INDEPENDENT_AMBULATORY_CARE_PROVIDER_SITE_OTHER): Payer: BC Managed Care – PPO | Admitting: Physician Assistant

## 2010-07-21 ENCOUNTER — Encounter: Payer: Self-pay | Admitting: Physician Assistant

## 2010-07-21 DIAGNOSIS — R079 Chest pain, unspecified: Secondary | ICD-10-CM

## 2010-07-22 NOTE — Assessment & Plan Note (Signed)
Summary: Per Dr Dallas Schimke pt of TW   Visit Type:  Follow-up Primary Provider:  Dr. Kriste Basque  CC:  Chest pain and Hypertension.  History of Present Illness: The patient presents for evaluation of difficulty for blood pressure and chest pain. The last several days she has had blood pressures that have been around 170/110. She's noticed this somewhat consistently. She did talk with her primary provider and had her the patient will increase to 400 mg b.i.d. yesterday. Her blood pressure was better today. She was seen in urgent care. However, because of chest discomfort she was referred here. She describes some discomfort it is a bit of heaviness that comes and goes lasting 3-4 minutes at a time. It is 4/10 in intensity. It is happening at rest and not reproducible with activity such as vacuuming. There has been no associated nausea vomiting or diaphoresis. She does not describe palpitations, presyncope or syncope. She has had no no wasting or edema. She has for having this discomfort before.  Current Medications (verified): 1)  Advair Diskus 250-50 Mcg/dose Aepb (Fluticasone-Salmeterol) .... Inhale 1 Puff Two Times A Day. Rinse Mouth Out After Each Use. 2)  Proair Hfa 108 (90 Base) Mcg/act Aers (Albuterol Sulfate) .... 2 Sprays Every 6 H As Needed For Wheezing... 3)  Adult Aspirin Ec Low Strength 81 Mg  Tbec (Aspirin) .... Take 1 Tablet By Mouth Once A Day 4)  Labetalol Hcl 200 Mg  Tabs (Labetalol Hcl) .... Take 2 Tablet By Mouth Two Times A Day 5)  Taztia Xt 360 Mg Xr24h-Cap (Diltiazem Hcl Er Beads) .... Take 1 Capsule By Mouth Once A Day 6)  Lisinopril 40 Mg  Tabs (Lisinopril) .... Take 1 By Mouth Once Daily 7)  Lasix 20 Mg Tabs (Furosemide) .... Take 1 Tab By Mouth Each Morning... 8)  Lipitor 40 Mg Tabs (Atorvastatin Calcium) .... Take 1 Tab By Mouth Once Daily.Marland KitchenMarland Kitchen 9)  Fish Oil 1000 Mg Caps (Omega-3 Fatty Acids) .... Take 1 Capsule By Mouth Once A Day 10)  Premarin 0.625 Mg Tabs (Estrogens Conjugated)  .... Take 1 Tablet By Mouth Once A Day 11)  Calcium Carbonate-Vitamin D 600-400 Mg-Unit  Tabs (Calcium Carbonate-Vitamin D) .... Take 1 Tablet By Mouth Two Times A Day 12)  Multivitamins   Tabs (Multiple Vitamin) .... Take 1 Tablet By Mouth Once A Day 13)  Vitamin D 2000 Unit Tabs (Cholecalciferol) .... Take 1 Tablet By Mouth Once A Day 14)  Ibuprofen 200 Mg Tabs (Ibuprofen) .... As Needed For Pain 15)  Sertraline Hcl 100 Mg Tabs (Sertraline Hcl) .... Take 2 Tabs By Mouth Each Am As Directed By Psychiatry... 16)  Alprazolam 0.5 Mg  Tb24 (Alprazolam) .... Take 1/2 To 1 Tablet Three Times A Day As Needed 17)  Temazepam 30 Mg Caps (Temazepam) .... Take 1 Capsule By Mouth Once A Day 18)  Prometrium 100 Mg Caps (Progesterone Micronized) .... Take Once Every 3 Months For 12 Days  Allergies (verified): No Known Drug Allergies  Past History:  Past Medical History: Reviewed history from 03/26/2010 and no changes required. ASTHMA (ICD-493.90) HYPERTENSION (ICD-401.9) SUPRAVENTRICULAR TACHYCARDIA (ICD-427.89) HYPERCHOLESTEROLEMIA (ICD-272.0) OVERWEIGHT (ICD-278.02) Hx of GERD (ICD-530.81) IRRITABLE BOWEL SYNDROME (ICD-564.1) SHOULDER PAIN, BILATERAL (ICD-719.41) STRESS FRACTURE, FOOT (ICD-733.94) OSTEOPENIA (ICD-733.90) ANXIETY (ICD-300.00) DEPRESSION (ICD-311) Family Hx of MELANOMA (ICD-172.9)  Past Surgical History: Reviewed history from 03/26/2010 and no changes required. D&C...miscarriage..1980 Cervical polyps removed 2005  Review of Systems       As stated in the HPI and negative for  all other systems.   Vital Signs:  Patient profile:   55 year old female Height:      67 inches Weight:      192.50 pounds BMI:     30.26 Pulse rate:   67 / minute BP sitting:   141 / 79  (left arm) Cuff size:   large  Vitals Entered By: Caralee Ates CMA (July 15, 2010 12:14 PM)  Physical Exam  General:  Well developed, well nourished, in no acute distress. Head:  normocephalic and  atraumatic Eyes:  PERRLA/EOM intact; conjunctiva and lids normal. Mouth:  Teeth, gums and palate normal. Oral mucosa normal. Neck:  Neck supple, no JVD. No masses, thyromegaly or abnormal cervical nodes. Chest Wall:  no deformities or breast masses noted Lungs:  Clear bilaterally to auscultation and percussion. Abdomen:  Bowel sounds positive; abdomen soft and non-tender without masses, organomegaly, or hernias noted. No hepatosplenomegaly. Msk:  Back normal, normal gait. Muscle strength and tone normal. Extremities:  No clubbing or cyanosis. Neurologic:  Alert and oriented x 3. Skin:  Intact without lesions or rashes. Cervical Nodes:  no significant adenopathy Axillary Nodes:  no significant adenopathy Inguinal Nodes:  no significant adenopathy Psych:  Normal affect.   Detailed Cardiovascular Exam  Neck    Carotids: Carotids full and equal bilaterally without bruits.      Neck Veins: Normal, no JVD.    Heart    Inspection: no deformities or lifts noted.      Palpation: normal PMI with no thrills palpable.      Auscultation: regular rate and rhythm, S1, S2 without murmurs, rubs, gallops, or clicks.    Vascular    Abdominal Aorta: no palpable masses, pulsations, or audible bruits.      Femoral Pulses: normal femoral pulses bilaterally.      Pedal Pulses: normal pedal pulses bilaterally.      Radial Pulses: normal radial pulses bilaterally.      Peripheral Circulation: no clubbing, cyanosis, or edema noted with normal capillary refill.     EKG  Procedure date:  07/15/2010  Findings:      sinus rhythm, rate 75, axis within normal limits, intervals within normal limits, no acute ST-T wave changes.  Impression & Recommendations:  Problem # 1:  CHEST PAIN, PRECORDIAL (ICD-786.51) Her chest pain is atypical and typical features. I think the pretest probability of coronary disease is relatively low. Will bring her back for an exercise treadmill test.  Orders: Treadmill  (Treadmill)  Problem # 2:  HYPERTENSION (ICD-401.9) We will evaluate her blood pressure at the time of her treadmill. For now she will continue with the increased dose of labetalol which could be changed to t.i.d. if her blood pressures remain elevated.  Problem # 3:  SUPRAVENTRICULAR TACHYCARDIA (ICD-427.89) She has had no further tachyarrhythmias. No change in therapy is indicated.  Problem # 4:  HYPERCHOLESTEROLEMIA (ICD-272.0) I reviewed the lipids most recently drawn with an HDL of 83 and LDL of 138.  I will increase her Lipitor to 80 mg daily. She should give the liver in about 8 weeks.  Patient Instructions: 1)  Your physician recommends that you schedule a follow-up appointment in: with Dr. Daleen Squibb 2)  Your physician recommends that you return for a FASTING lipid & liver profile in 6 weeks 3)  Your physician has recommended you make the following change in your medication: Increase lipitor 4)  Your physician has requested that you have an exercise tolerance test.  For further  information please visit [email or web address]  Please also follow instruction sheet, as given.  THIS WEEK if possible. Prescriptions: LIPITOR 80 MG TABS (ATORVASTATIN CALCIUM) Take one tablet by mouth daily.  #30 x 6   Entered by:   Lisabeth Devoid RN   Authorized by:   Rollene Rotunda, MD, [facility]   Signed by:   Lisabeth Devoid RN on 07/15/2010   Method used:   Electronically to        [facility]. [phone]* (retail)       [address]       [place], [place]  [address]       Ph: [phone]       Fax: [phone]   RxID:   [id number]  I have reviewed and approved all prescriptions at the time of this visit. Rollene Rotunda, MD, [facility]  July 15, 2010 1:35 PM

## 2010-07-22 NOTE — Progress Notes (Signed)
Summary: high bp  Phone Note Call from Patient Call back at [facility] Phone [phone]   Caller: Patient Call For: Agnes Brightbill Summary of Call: pt chesked her bp this morning. it was 186 / 94. she took her bp meds and checked it again 20 mins ago and it was 161 / 93.  Initial call taken by: Tivis Ringer, CNA,  July 14, 2010 8:52 AM  Follow-up for Phone Call        Pt states she checked her blood pressure 15 minutes ago and it was BP-153/94. Has taken 2 ASA 81mg . c/o headaches (frontal pressure), fatigue, stomach pain since starting this cycle of Prometrium on March 1. Pt called Dr. Jamesetta Orleans office first, who advised her to call Dr. Kriste Basque due to her BP issues. Pt denies vision changes and being lightheaded. Pt states BP readings have been averaging 136/79. States is taking BP medications as directed. Please advise. Thanks.Zackery Barefoot CMA,  July 14, 2010 10:52 AM  Additional Follow-up for Phone Call Additional follow up Details #1::        per SN---this could all be related to the elevated BP---be sure no salt, no psudophedrin, minimize  caffeine use, etc---increase the labetalol 200mg   2 by mouth two times a day and rov with SN in 3-4 wks for recheck.  thanks Randell Loop CMA  July 14, 2010 2:01 PM     Additional Follow-up for Phone Call Additional follow up Details #2::    Pt informed of SN recs and appointment scheduled for August 13, 2010. Zackery Barefoot CMA  July 14, 2010 2:17 PM

## 2010-08-07 ENCOUNTER — Ambulatory Visit (HOSPITAL_COMMUNITY): Payer: BC Managed Care – PPO | Attending: Cardiology | Admitting: Radiology

## 2010-08-07 VITALS — Ht 66.5 in | Wt 191.0 lb

## 2010-08-07 DIAGNOSIS — R0789 Other chest pain: Secondary | ICD-10-CM

## 2010-08-07 DIAGNOSIS — R9430 Abnormal result of cardiovascular function study, unspecified: Secondary | ICD-10-CM

## 2010-08-07 DIAGNOSIS — R079 Chest pain, unspecified: Secondary | ICD-10-CM | POA: Insufficient documentation

## 2010-08-07 DIAGNOSIS — R0609 Other forms of dyspnea: Secondary | ICD-10-CM

## 2010-08-07 MED ORDER — REGADENOSON 0.4 MG/5ML IV SOLN: 0.4000 mg | Freq: Once | INTRAVENOUS | Status: DC

## 2010-08-07 MED ORDER — TECHNETIUM TC 99M TETROFOSMIN IV KIT
11.0000 | PACK | Freq: Once | INTRAVENOUS | Status: AC | PRN
  Administered 2010-08-07: 11 via INTRAVENOUS

## 2010-08-07 MED ORDER — TECHNETIUM TC 99M TETROFOSMIN IV KIT
33.0000 | PACK | Freq: Once | INTRAVENOUS | Status: AC | PRN
  Administered 2010-08-07: 33 via INTRAVENOUS

## 2010-08-07 NOTE — Progress Notes (Signed)
[facility] SITE 3 NUCLEAR MED [address] [place] [address] [phone]  Cardiology Nuclear Med Study Monique Weber female 1955/12/06   Nuclear Med Background Indication for Stress Test:  Evaluation for Ischemia History:  '06 Echo:EF=55-60%, 07/21/10 [id number] ST changes, h/o SVT Cardiac Risk Factors: Family History - CAD, Hypertension, Overweight and Smoker  Symptoms:  Chest Pressure.  (last date of chest discomfort was about a week ago), DOE, Fatigue, Light-Headedness and Rapid HR   Nuclear Pre-Procedure Caffeine/Decaff Intake:  7:00pm NPO After: 8:30pm   Lungs:  Clear.  O2 sat 97% on RA IV 0.9% NS with Angio Cath:  18g  IV Site: R Antecubital  IV Started by:  Stanton Kidney, EMT-P  Chest Size (in):  40 Cup Size:   C  Height: 5' 6.5" (1.689 m)  Weight:  191 lb (86.637 kg)  BMI:  Body mass index is 30.37 kg/(m^2). Tech Comments:  Labatolol held this a.m.    Nuclear Med Study 1 or 2 day study: 1 day  Stress Test Type:  Treadmill/Lexiscan  Reading MD: Dietrich Pates, MD  Order Authorizing Provider:  Valera Castle, MD  Resting Radionuclide: Technetium 52m Tetrofosmin  Resting Radionuclide Dose: 11 mCi   Stress Radionuclide:  Technetium 44m Tetrofosmin  Stress Radionuclide Dose: 33 mCi           Stress Protocol Rest HR: 59 Stress HR: 97  Rest BP: 133/85 Stress BP: 157/76  Exercise Time:  2:00 min METS: n/a  Predicted HR: n/a % of Maximum: n/a        Dose of Adenosine:  n/a Dose of Lexiscan:  0.4 mg  Dose of Atropine:  n/a Dose of Dobutamine:  n/a  Stress Test Technologist: [employer], CMA-N  Nuclear Technologist:  Domenic Polite, CNMT     Rest Procedure:  Myocardial perfusion imaging was performed at rest 45 minutes following the intravenous administration of Technetium 20m Tetrofosmin. Rest ECG: No acute changes.  Stress Procedure:  The patient received IV Lexiscan 0.4 mg over 15-seconds with concurrent low level exercise and then  Technetium 29m Tetrofosmin was injected at 30-seconds while the patient continued walking one more minute.  There were no significant changes with Lexiscan.  She did c/o chest tightness, 4-5/10, with infusion.  Quantitative spect images were obtained after a 45-minute delay. Stress ECG: No significant ST segment change suggestive of ischemia.  QPS Raw Data Images:  Soft tissue (diaphragm, breast, arm shadow) surround heart. Stress Images:  Normal homogeneous uptake in all areas of the myocardium. Rest Images:  Normal homogeneous uptake in all areas of the myocardium. Subtraction (SDS):  No evidence of ischemia.  Findings Risk Category:  Normal nuclear study. Clinically Abnormal:  Mild chest pressure in recovery. Ischemia:  No Fixed Defect:  No LV Dysfunction:  No Transient Ischemic Dilatation (Normal <1.22):  .98 Lung/Heart Ratio (Normal <0.45):  .42  Quantitative Gated Spect Images QGS EDV:  89 ml QGS ESV:  27 ml QGS cine images:  Normal wall motion. QGS EF:  70 %  Impression Exercise Capacity:  Lexiscan with low level exercise. BP Response:  Normal blood pressure response. Clinical Symptoms:  Mild chest pain/dyspnea. ECG Impression:  No significant ST segment change suggestive of ischemia. Comparison with Prior Nuclear Study: No previous nuclear study performed  Overall Impression:  Normal stress nuclear study.

## 2010-08-08 ENCOUNTER — Telehealth: Payer: Self-pay | Admitting: Physician Assistant

## 2010-08-08 NOTE — Telephone Encounter (Signed)
Notify patient myoview is normal.

## 2010-08-08 NOTE — Telephone Encounter (Signed)
S/w pt husband stress test normal EF 70%, said he will tell wife when she gets home tonight from work. Danielle Rankin, CMA

## 2010-08-11 ENCOUNTER — Encounter: Payer: Self-pay | Admitting: Pulmonary Disease

## 2010-08-12 ENCOUNTER — Other Ambulatory Visit: Payer: BC Managed Care – PPO

## 2010-08-13 ENCOUNTER — Ambulatory Visit: Payer: Self-pay | Admitting: Pulmonary Disease

## 2010-08-13 ENCOUNTER — Other Ambulatory Visit: Payer: BC Managed Care – PPO

## 2010-09-15 ENCOUNTER — Other Ambulatory Visit (INDEPENDENT_AMBULATORY_CARE_PROVIDER_SITE_OTHER): Payer: BC Managed Care – PPO | Admitting: Pulmonary Disease

## 2010-09-15 ENCOUNTER — Other Ambulatory Visit: Payer: Self-pay | Admitting: Pulmonary Disease

## 2010-09-15 ENCOUNTER — Other Ambulatory Visit (INDEPENDENT_AMBULATORY_CARE_PROVIDER_SITE_OTHER): Payer: BC Managed Care – PPO

## 2010-09-15 DIAGNOSIS — Z13220 Encounter for screening for lipoid disorders: Secondary | ICD-10-CM

## 2010-09-15 DIAGNOSIS — E785 Hyperlipidemia, unspecified: Secondary | ICD-10-CM

## 2010-09-15 LAB — HEPATIC FUNCTION PANEL
ALT: 34 U/L (ref 0–35)
Bilirubin, Direct: 0 mg/dL (ref 0.0–0.3)
Total Bilirubin: 0.6 mg/dL (ref 0.3–1.2)

## 2010-09-15 LAB — LIPID PANEL
HDL: 80.4 mg/dL (ref >39.00)
Total CHOL/HDL Ratio: 3

## 2010-09-16 ENCOUNTER — Ambulatory Visit (INDEPENDENT_AMBULATORY_CARE_PROVIDER_SITE_OTHER): Payer: BC Managed Care – PPO | Admitting: Pulmonary Disease

## 2010-09-16 DIAGNOSIS — I498 Other specified cardiac arrhythmias: Secondary | ICD-10-CM

## 2010-09-16 DIAGNOSIS — K219 Gastro-esophageal reflux disease without esophagitis: Secondary | ICD-10-CM

## 2010-09-16 DIAGNOSIS — E7800 Pure hypercholesterolemia, unspecified: Secondary | ICD-10-CM

## 2010-09-16 DIAGNOSIS — K589 Irritable bowel syndrome without diarrhea: Secondary | ICD-10-CM

## 2010-09-16 DIAGNOSIS — M25519 Pain in unspecified shoulder: Secondary | ICD-10-CM

## 2010-09-16 DIAGNOSIS — I10 Essential (primary) hypertension: Secondary | ICD-10-CM

## 2010-09-16 DIAGNOSIS — F411 Generalized anxiety disorder: Secondary | ICD-10-CM

## 2010-09-16 DIAGNOSIS — E663 Overweight: Secondary | ICD-10-CM

## 2010-09-16 NOTE — Patient Instructions (Signed)
Today we updated your med list in our EPIC system...    Continue your current meds the same...  We reviewed your recent blood work> improved, keep up the good work w/ diet + exercise... Call for any problems... Let's plan a follow up visit in 6 months.Marland KitchenMarland Kitchen

## 2010-09-18 ENCOUNTER — Other Ambulatory Visit: Payer: Self-pay | Admitting: *Deleted

## 2010-09-18 MED ORDER — LABETALOL HCL 200 MG PO TABS: 400.0000 mg | ORAL_TABLET | Freq: Two times a day (BID) | ORAL | Status: DC

## 2010-09-23 NOTE — Assessment & Plan Note (Signed)
[facility]                            CARDIOLOGY OFFICE NOTE   NAME:[name], [name]                     MRN:          [id number]  DATE:07/17/2008                            DOB:          10-Oct-1955    [name] returns today for followup.  Other than her blood pressure  being out of control recently requiring the addition of clonidine 0.1  b.i.d., she has had no complaints.  She specifically had no symptoms of  her SVT.   She is due a follow with Dr. Kriste Basque for blood work.  She admits to not  being there for a while.   She is currently on Premarin 0.625 mg per day, Prometrium 200 mg every 3  months, Taztia XT 360 mg a day, Accolate 200 mg a day, alprazolam 0.5  one in the morning and two in the evening, Advair Diskus 250/50 every  other day, labetalol 200 mg p.o. b.i.d., simvastatin 80 mg nightly,  lisinopril 40 mg a day, clonidine 0.1 mg b.i.d.   PHYSICAL EXAMINATION:  VITAL SIGNS:  Blood pressure is 142/80, her pulse  63 and regular.  Weight is 193.  Her EKG shows sinus rhythm with first-  degree AV block of 230 milliseconds, which is slightly increased from  her last EKG.  There is no delta wave.  There is no abnormality over  QRS.  Weight is 193, stable.  HEENT:  Normal.  NECK:  Supple.  Carotid upstrokes were equal bilaterally without bruits,  no JVD.  Thyroid is not enlarged.  Trachea is midline.  CHEST/LUNGS:  Clear to auscultation percussion.  HEART:  Difficult to appreciate PMI.  Normal S1 and S2.  ABDOMEN:  Soft, good bowel sounds.  No midline bruit.  No hepatomegaly.  EXTREMITIES:  No cyanosis, clubbing, or edema.  Pulses are intact.  NEURO:  Intact.  SKIN:  Unremarkable.   ASSESSMENT/PLAN:  [name]'s supraventricular tachycardia is  quiescent.  I have asked her to make no changes in her program.  She  will follow up with Dr. Kriste Basque with blood pressure needs, as well as are  her annual labs.  I will see her back again in a  year.     Thomas C. Daleen Squibb, MD, [facility]  Electronically Signed    TCW/MedQ  DD: 07/17/2008  DT: 07/18/2008  Job #: [id number]

## 2010-09-23 NOTE — Assessment & Plan Note (Signed)
[place] [facility]                             PULMONARY OFFICE NOTE   NAME:Monique Weber, Monique Weber                     MRN:          [id number]  DATE:11/16/2006                            DOB:          April 29, 1956    HISTORY OF PRESENT ILLNESS:  The patient is a 55 year old white female  patient of Dr. Jodelle Green who has a known history of asthma, hypertension,  and hyperlipidemia, who presents today for a blood pressure followup.  The patient complains that, over the last 4 days, she has noted her  blood pressure to be elevated, averaging systolic blood pressures  somewhere around 160 to 180.  The patient states that she was slightly  fatigued with some mild headache.  The patient denied any visual  changes, extremity weakness, severe headache, nausea, vomiting,  palpitations, chest pain, or shortness of breath.  The patient also has  not been using any over-the-counter medications.  She has been taking  her medications regularly without any missed doses.   The patient is maintained on:  1. Cardizem 360 mg daily.  2. Lisinopril 40 mg daily.  3. Labetalol 200 mg b.i.d.  4. Lasix 20 mg daily.   PAST MEDICAL HISTORY:  Reviewed.   PHYSICAL EXAM:  The patient is a pleasant female in no acute distress.  She is afebrile.  Blood pressure recheck is 128/78 in the left and  130/80 in the right.  HEENT:  Unremarkable.  NECK:  Supple without cervical adenopathy.  No JVD.  LUNGS:  Sounds are clear to auscultation bilaterally.  CARDIAC:  Regular rate and rhythm.  ABDOMEN:  Soft and nontender.  EXTREMITIES:  Warm without calf tenderness, cyanosis, clubbing, or  edema.  NEURO:  No focal deficit or changes.   IMPRESSION AND PLAN:  Hypertension with recent elevated blood pressures.  Blood pressure is controlled at today's visit.  I have recommended that  the patient check blood pressures daily first thing in the morning at  rest, to continue on a low sodium diet, avoid  over-the-counter products,  such as decongestants and nonsteroidals.  The patient is also  recommended on diet and exercise, along with weight loss.  The patient  has assured me that she will begin a weight loss and exercise regimen,  and will return back here in 4 weeks with a blood pressure log.  I have  discussed adding in additional therapy at this time.  However, the  patient wants to wait and determine if she can lose some weight before  we add in any additional  medications.  The patient is to contact our office if blood pressures  continue to be elevated for sooner followup.      Rubye Oaks, NP  Electronically Signed      Lonzo Cloud. Kriste Basque, MD  Electronically Signed   TP/MedQ  DD: 11/19/2006  DT: 11/20/2006  Job #: [id number]

## 2010-09-23 NOTE — Assessment & Plan Note (Signed)
[facility]                            CARDIOLOGY OFFICE NOTE   NAME:[name], [name]                     MRN:          [id number]  DATE:03/29/2007                            DOB:          Sep 09, 1955    [name] returns today for further management of the following  issues:   1. SVT.  She has been totally asymptomatic and well-controlled on      medications.  She does not want an ablation!  2. Hypertension, under better control with Dr. Jodelle Green regimen.  3. Hyperlipidemia.  She had a suboptimal response to Vytorin, though      she had done very well with simvastatin and Zetia separately.  She      is now on simvastatin 40 mg a day.  Her LDL was last about 187.  I      told her she may need to go to a higher dose with her goal being      less than 100.  She does have a good HDL of 68.7.   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  She says she is having a good  time.  She is walking.   MEDICATIONS:  1. Aspirin 81 mg daily,  2. Cardizem CD 360 mg daily.  3. Lisinopril 40 mg daily.  4. Labetalol 200 mg p.o. b.i.d.  5. Lasix 20 mg a day.  6. Simvastatin 40 mg a day.  7. Zoloft 100 mg q.a.m.   Her blood pressure is 135/85, her pulse is 70 and regular.  Weight is  194, up 5.  HEENT:  Unchanged.  Carotid upstrokes are equal bilaterally without  bruits.  No JVD.  Thyroid is not enlarged.  Trachea is midline.  LUNGS:  Clear.  HEART:  A nondisplaced PMI.  She has normal S1, S2 without gallop.  ABDOMEN:  Soft, good bowel sounds.  EXTREMITIES:  No edema.  Pulses are intact.  NEUROLOGIC:  Intact.  SKIN:  Unremarkable.   EKG shows normal sinus rhythm with a minimal first-degree AV block.  This is unchanged.   ASSESSMENT AND PLAN:  [name] is doing well.  I have made no  changes in her program.  I do think she will need a higher dose of  simvastatin or to switch to a stronger statin down the road.  I will  leave this to Dr. Jodelle Green expertise.   I have told her that I would like  her LDL below 100, however.   I will see her back in a year.     Thomas C. Daleen Squibb, MD, [facility]  Electronically Signed    TCW/MedQ  DD: 03/29/2007  DT: 03/30/2007  Job #: [id number]

## 2010-09-26 NOTE — Assessment & Plan Note (Signed)
[facility]                             PULMONARY OFFICE NOTE   NAME:Monique Weber, Monique Weber                     MRN:          [id number]  DATE:07/07/2006                            DOB:          Dec 06, 1955    HISTORY OF PRESENT ILLNESS:  The patient is a 55 year old white female  patient of Dr. Jodelle Green who has a known history of hypertension,  supraventricular tachycardia, followed by Dr. Daleen Squibb, and asthma.  Presents today for an acute office visit.  Patient actually has multiple  complaints today.  Patient complains that over the last week she has had  nasal congestion, sinus pain and pressure, chills, sweats, and a  headache behind her left eye.  Patient has been using some ibuprofen  pretty much on a daily basis for her headaches.  Patient also has  noticed that her blood pressure has been elevated quite a bit over the  last 2 days.  On 1 occasion the patient took her blood pressure, and it  was 219/124, and then the next day it went down to 154/85.  Patient also  complains that she has had a somewhat strong odor to her urine recently.  Patient denies any chest pain, exertional chest pain, palpitations,  abdominal pain, nausea or vomiting, fever, body aches, dysuria,  hematuria or urgency.   PAST MEDICAL HISTORY:  Reviewed.   CURRENT MEDICATIONS:  Reviewed.   PHYSICAL EXAMINATION:  Patient is a pleasant female in no acute  distress.  She is afebrile with stable vital signs.  HEENT:  Nasal mucosa is erythematous and swollen.  Maxillary sinus  tenderness to percussion.  NECK:  Supple without cervical adenopathy.  No JVD.  LUNG SOUNDS:  Clear.  CARDIAC:  Is a regular rate and rhythm without murmur, rub or gallop.  ABDOMEN:  Soft without any hepatosplenomegaly.  No guarding or rebound  noted.  Negative CVA tenderness.  EXTREMITIES:  Warm without any calf tenderness, cyanosis, clubbing or  edema.  NEUROLOGIC:  Alert and oriented x3.  No focal deficits  to testing.  Cranial nerves 2 through 12 are intact.  Equal strength of the upper and  lower extremities.  Moves all extremities well.  Negative rhomberg.  Negative pronated drift.   IMPRESSION AND PLAN:  1. Acute rhinitis with probable early sinusitis.  Patient is to begin      Omnicef x10 days.  Mucinex twice daily.  A saline nasal spray as      needed.  2. Hypertension.  Patient is recommended to stop all nonsteroidal      products over the counter.  Will add in Lasix 20 mg daily, and      continue on present regimen.  Labs, including a CBC,      CMET, TSH, are pending.  A urinalysis and A1c are also currently      pending.  Patient will return back here in the office in 2 weeks,      or sooner if needed for a blood pressure followup.      Rubye Oaks, NP  Electronically Signed  Lonzo Cloud. Kriste Basque, MD  Electronically Signed   TP/MedQ  DD: 07/09/2006  DT: 07/09/2006  Job #: [id number]

## 2010-09-26 NOTE — Assessment & Plan Note (Signed)
[facility]                              CARDIOLOGY OFFICE NOTE   NAME:Monique Weber, Monique Weber                     MRN:          [id number]  DATE:03/17/2006                            DOB:          1955/09/27    [name] returns today for further management of her SVT.  She has had  only a few brief episodes lasting less than a minute.  A higher dose of  Cardizem to 360 q. day has seemed to help.   She has been following along with Dr. Kriste Basque and Babette Relic.  Blood work November 12, 2005 was unremarkable.  LFTs specifically were within normal limits except  for a slight increase in ALT to 42.   MEDICATIONS:  1. Aspirin 81 mg a day.  2. Accolate 20 mg p.o. b.i.d.  3. Vytorin 10/40 q. day.  4. Cardizem 360 q. day.  5. Advair 250/50 one puff b.i.d.  6. Lisinopril 40 mg a day.  7. Zoloft 50 mg a day.  8. Labetalol 20 mg p.o. b.i.d.   Her blood pressure is 142/92, pulse is 57 and regular, her weight is 191.  She is in no acute distress.  HEENT:  Normocephalic, atraumatic.  PERRLA.  Extraocular movements intact.  Dentition is satisfactory.  Carotid upstrokes are equal bilaterally without bruits.  There is no JVD.  Thyroid is not enlarged.  Trachea is midline.  LUNGS:  Clear.  HEART:  Regular rate and rhythm with S4.  ABDOMEN:  Soft with good bowel sounds.  EXTREMITIES:  No edema.  Pulses are intact.  Neuro exam is intact.   EKG shows sinus brady with a first degree AV block.   ASSESSMENT AND PLAN:  1. Supraventricular tachycardia.  This seems to be extremely well      controlled on diltiazem 360 q. day.  She knows if she has a lot of      breakthroughs to let me know.  Electrophysiology evaluation ablation      may be a possibility.  2. Hypertension.  We talked about this last year.  She is on lisinopril      40, Cardizem 360.  Dr. Kriste Basque just added labetalol 200 mg p.o. b.i.d.      She will follow up with Tammy and Dr. Kriste Basque concerning this.   I will  plan on seeing her back in a year.  Her prescription was renewed for  diltiazem extended release 360 q. day.     Thomas C. Daleen Squibb, MD, [facility]  Electronically Signed    TCW/MedQ  DD: 03/17/2006  DT: 03/17/2006  Job #: [id number]   cc:   Lonzo Cloud. Kriste Basque, MD

## 2010-10-06 ENCOUNTER — Encounter: Payer: Self-pay | Admitting: Pulmonary Disease

## 2010-10-06 NOTE — Progress Notes (Signed)
Subjective:    Patient ID: Monique Weber, female    DOB: Jan 02, 1956, 55 y.o.   MRN: [id number]  HPI 55 y/o WF here for a follow up visit... she has multiple medical problems as noted below...    ~  Sep 23, 2009:  she's had a good 50mo- had some right chest wall trauma from a gate- pain & bruise, now resolved w/o residual... she has decr her Simvastatin to 40mg /d per her insurance company- problem is her last FLP wasn't at goal on 80mg /d & we discussed checking FLP on 40mg  before deciding on changing to another statin...  ~  March 26, 2010:  she's been stable over the last 50mo- no new complaints or concerns... her breathing has been doing well on Advair & Proair;  BP controlled on 4 med regimen below & no palpit etc;  Chol is not at goal on Simva40 & we discussed switch to Lipitor40mg /d;  she needs to work on weight reduction;  still under alot of stress but doing satis on Zoloft & Alpraz...   ~  Sep 17, 2010:  50mo ROV & she had some CP & HBP in the interim, went to [facility], sent to Cards & seen by DrHochrein w/ adjustment in meds & incr in Lipitor to 80mg /d;  She was unable to walk suffic on treadmill for GXT (poor exercise tolerance) & is supposed to ret for Myoview;  She has since stopped the Lisinopril "it made me off balance" (this was suggested by a [place] physician friend) & she is now doing "90d mindful meditation- it was in [place]";  BP currently satis on Labetolol, Diltiazem, & Lasix;  Her Simva40 was changed to Lip80 & FLP doesn't look much better> she is encouraged to get on a good low chol/ low fat diet, work on weight reduction, increase exercise program, etc;  She still sees DrMcKinney/ MBaker for her nerves on Zoloft, Xanax, Restoril...          Problem List:  ASTHMA (ICD-493.90) - on ADVAIR 250 Bid & PROAIR HFA Prn... prev on Accolate but was only taking it 1-2 per week therefore discontinued... hx asthmatic bronchitis w/o exac x yrs although incr difficulty in the spring...  currently doing well- denies cough, sputum, hemoptysis, worsening dyspnea,  wheezing, chest pains, snoring, daytime hypersomnolence, etc...  ~  CXR 11/10 showed borderline cardiomeg, clear lungs, sm left diaph defect...  HYPERTENSION (ICD-401.9) - on ASA 81mg /d,  LABETOLOL 200Bid, DILTIAZEM ER 360mg /d, & LASIX 20mg /d... takes meds regularly & tol well... BP = 136/78 & similar at home... denies HA, fatigue, visual changes, CP, palipit, dizziness, syncope, dyspnea, edema, etc...  ~  baseline EKG shows NSR, WNL... ~  2DEcho 11/06 was normal- EF= 55-65% & no regional wall motion abn. ~  3/12:  She had treadmill exercise test showing poor exercise tolerance & was non-diagnostic; they plan Myoview scan...  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 no recent CP, palpit, etc...  HYPERCHOLESTEROLEMIA (ICD-272.0) - on SIMVASTATIN 40mg Qhs + FishOil...  ~  FLP 8/08 on Vytor10/40 showed TChol 264, TG 93, HDL 69, LDL 187... change to Simva80. ~  FLP 12/08 on Simva80 showed TChol 208, TG 78, HDL 61, LDL 139... continue med, better diet. ~  FLP 7/10 off med showed TChol 260, TG 85, HDL 96, LDL 156... rec> restart Simva80. ~  FLP 11/10 on Simva80 showed TChol 208, TG 89, HDL 80, LDL 130... she decr to Simva40 per [employer] ~  FLP 5/11 on Simva40 showed TChol 227, TG 119, HDL 84, LDL 138 ~  FLP 11/11 on Simva40 showed TChol 233, TG 128, HDL 88, LDL 136... rec> ch to LIP40, later incr to LIP80. ~  FLP 5/12 on Lip80 showed TChol 206, TG 109, HDL 80, LDL 113  OVERWEIGHT (ICD-278.02) - we reviewed diet + exercise program necessary to lose weight. ~  weight 8/08 = 185# ~  weight 7/10 = 201# ~  weight 11/10 = 185# ~  weight 5/11 = 187# ~  weight 11/11 = 191# ~  Weight 5/12 = 198#  Hx of GERD (ICD-530.81) - uses PRILOSEC OTC 20mg  Prn.  IRRITABLE BOWEL SYNDROME (ICD-564.1) - prev followed for GI by DrJohnson but she switched to [facility]... ~   colonoscopy 5/07 by DrJohnson was WNL.Marland Kitchen. she saw DrNat-Mann after that for post-colonoscopy pain & dx w/ IBS...  SHOULDER PAIN, BILATERAL (ICD-719.41) & STRESS FRACTURE, FOOT (ICD-733.94) - she's had right rotator cuff tear and eval by Ortho, then fall w/ injury to left shoulder... she sees chiropractor Prn w/ good relief of symptoms.... s/p bilat stress fractures of the feet.  ANXIETY (ICD-300.00) & DEPRESSION (ICD-311) - on ALPRAZOLAM 0.5mg  Tid, ZOLOFT 100mg - 2Qam, & TEMAZEPAM 30mg Qhs... eval by DrMcKinney for Psychiatry & prev counselling as well...  Family Hx of MELANOMA (ICD-172.9) - both parents have hx malig melanoma removed- pt sees Derm Q66mo...  Health Maintenance:  GYN prev DrCollins, now DrNeal- on Premarin 0.625/ Prometrium (x12d every 3 mo)... also takes Calcium/ MVI/ Vit D 2000 u daily... she reports BMD in 2009 [facility]) w/ Osteopenia...   Past Surgical History  Procedure Date  . Dilation and curettage of uterus 1980    miscarriage 1980  . Cervical polypectomy 2005    Outpatient Encounter Prescriptions as of 09/16/2010  Medication Sig Dispense Refill  . albuterol (PROAIR HFA) 108 (90 BASE) MCG/ACT inhaler Inhale 2 puffs into the lungs every 6 (six) hours as needed.        . ALPRAZolam (XANAX) 0.5 MG tablet Take 0.5 mg by mouth 3 (three) times daily as needed.        Marland Kitchen aspirin 81 MG tablet Take 81 mg by mouth daily.        Marland Kitchen atorvastatin (LIPITOR) 80 MG tablet Take 80 mg by mouth daily.        . Calcium Carbonate-Vitamin D (CALCIUM 600+D) 600-400 MG-UNIT per tablet Take 1 tablet by mouth 2 (two) times daily.        . Cholecalciferol (VITAMIN D) 2000 UNITS CAPS Take 1 capsule by mouth daily.        Marland Kitchen diltiazem (TIAZAC) 360 MG 24 hr capsule Take 360 mg by mouth daily.        Marland Kitchen estrogens, conjugated, (PREMARIN) 0.625 MG tablet Take 0.625 mg by mouth daily. Take daily for 21 days then do not take for 7 days.       . fish oil-omega-3 fatty acids 1000 MG capsule Take 2 g by mouth  daily.        . Fluticasone-Salmeterol (ADVAIR DISKUS) 250-50 MCG/DOSE AEPB Inhale 1 puff into the lungs every 12 (twelve) hours.        . furosemide (LASIX) 20 MG tablet Take 20 mg by mouth daily.        Marland Kitchen ibuprofen (ADVIL,MOTRIN) 200 MG tablet Take 200 mg by mouth every 6 (six) hours as needed.        . Multiple Vitamins-Minerals (MULTIVITAMIN WITH MINERALS) tablet  Take 1 tablet by mouth daily.        . progesterone (PROMETRIUM) 100 MG capsule Take once every three months for 12 days       . sertraline (ZOLOFT) 100 MG tablet Take 200 mg by mouth daily.        . temazepam (RESTORIL) 30 MG capsule Take 30 mg by mouth at bedtime as needed.        .  labetalol (NORMODYNE) 200 MG tablet Take 400 mg by mouth 2 (two) times daily.    ==> she is still on this med.    Marland Kitchen DISCONTD: lisinopril (PRINIVIL,ZESTRIL) 40 MG tablet Take 40 mg by mouth daily.    ==> she stopped this med.      No Known Allergies   Review of Systems       See HPI - all other systems neg except as noted... The patient denies anorexia, fever, weight loss, weight gain, vision loss, decreased hearing, hoarseness, chest pain, syncope, dyspnea on exertion, peripheral edema, prolonged cough, headaches, hemoptysis, abdominal pain, melena, hematochezia, severe indigestion/heartburn, hematuria, incontinence, muscle weakness, suspicious skin lesions, transient blindness, difficulty walking, depression, unusual weight change, abnormal bleeding, enlarged lymph nodes, and angioedema.     Objective:   Physical Exam      WD, Overweight, 55 y/o WF in NAD... Vital signs:  Reviewed... GENERAL:  Alert & oriented; pleasant & cooperative. HEENT:  [place]/AT, EOM-wnl, PERRLA, Fundi-benign, EACs-clear, TMs-wnl, NOSE-clear, THROAT-clear & wnl. NECK:  Supple w/ full ROM; no JVD; normal carotid impulses w/o bruits; no thyromegaly or nodules palpated; no lymphadenopathy. CHEST:  Clear to P & A; without wheezes/ rales/ or rhonchi. HEART:  Regular Rhythm;  without murmurs/ rubs/ or gallops. ABDOMEN:  Soft & nontender; normal bowel sounds; no organomegaly or masses detected. EXT: without deformities or arthritic changes; no varicose veins/ venous insuffic/ or edema. NEURO:  CN's intact; motor testing normal; sensory testing normal; gait normal & balance OK. DERM:  No lesions noted; no rash etc...   Assessment & Plan:   ASTHMA>  Stable w/o exac, continue Rx w/ Advair regularly, & Proair rescue...  HBP>  BP seems better Controlled on larger dose of Labet, & off the Lisinopril; continue current meds the same...  CHOL>  Improved on the Lip80 but still not at goal> needs better diet & get wt down, same med for now...  Overwt>  Wt reduction is the key to improving the other parameters...  Anxiety>  She continues f/u w/ DrMcKinney & MBaker.Marland KitchenMarland Kitchen

## 2010-10-10 ENCOUNTER — Other Ambulatory Visit: Payer: Self-pay | Admitting: Adult Health

## 2010-12-22 ENCOUNTER — Other Ambulatory Visit: Payer: Self-pay | Admitting: Pulmonary Disease

## 2011-03-25 ENCOUNTER — Other Ambulatory Visit: Payer: Self-pay | Admitting: Pulmonary Disease

## 2011-04-14 ENCOUNTER — Other Ambulatory Visit: Payer: Self-pay | Admitting: Cardiology

## 2011-04-20 ENCOUNTER — Other Ambulatory Visit: Payer: Self-pay | Admitting: Pulmonary Disease

## 2011-05-18 ENCOUNTER — Other Ambulatory Visit: Payer: Self-pay | Admitting: Pulmonary Disease

## 2011-05-29 ENCOUNTER — Other Ambulatory Visit: Payer: Self-pay | Admitting: Pulmonary Disease

## 2011-06-21 ENCOUNTER — Other Ambulatory Visit: Payer: Self-pay | Admitting: Pulmonary Disease

## 2011-06-25 ENCOUNTER — Telehealth: Payer: Self-pay | Admitting: Pulmonary Disease

## 2011-06-25 MED ORDER — LABETALOL HCL 200 MG PO TABS: ORAL_TABLET | ORAL | Status: DC

## 2011-06-25 NOTE — Telephone Encounter (Signed)
I spoke with pt and she stated she needed her labetolol refilled. Pt is scheduled is scheduled to see SN 07/28/11 at 12:00. Pt did not need any other refills. Rx has beens ent

## 2011-07-23 ENCOUNTER — Other Ambulatory Visit: Payer: Self-pay | Admitting: Pulmonary Disease

## 2011-07-28 ENCOUNTER — Encounter: Payer: Self-pay | Admitting: Pulmonary Disease

## 2011-07-28 ENCOUNTER — Ambulatory Visit (INDEPENDENT_AMBULATORY_CARE_PROVIDER_SITE_OTHER): Payer: BC Managed Care – PPO | Admitting: Pulmonary Disease

## 2011-07-28 VITALS — BP 150/90 | HR 67 | Temp 98.0°F | Ht 67.0 in | Wt 194.0 lb

## 2011-07-28 DIAGNOSIS — F411 Generalized anxiety disorder: Secondary | ICD-10-CM

## 2011-07-28 DIAGNOSIS — K219 Gastro-esophageal reflux disease without esophagitis: Secondary | ICD-10-CM

## 2011-07-28 DIAGNOSIS — M542 Cervicalgia: Secondary | ICD-10-CM

## 2011-07-28 DIAGNOSIS — E663 Overweight: Secondary | ICD-10-CM

## 2011-07-28 DIAGNOSIS — F329 Major depressive disorder, single episode, unspecified: Secondary | ICD-10-CM

## 2011-07-28 DIAGNOSIS — K589 Irritable bowel syndrome without diarrhea: Secondary | ICD-10-CM

## 2011-07-28 DIAGNOSIS — M949 Disorder of cartilage, unspecified: Secondary | ICD-10-CM

## 2011-07-28 DIAGNOSIS — I10 Essential (primary) hypertension: Secondary | ICD-10-CM

## 2011-07-28 DIAGNOSIS — J45909 Unspecified asthma, uncomplicated: Secondary | ICD-10-CM

## 2011-07-28 DIAGNOSIS — E7800 Pure hypercholesterolemia, unspecified: Secondary | ICD-10-CM

## 2011-07-28 MED ORDER — ALBUTEROL SULFATE HFA 108 (90 BASE) MCG/ACT IN AERS: 2.0000 | INHALATION_SPRAY | Freq: Four times a day (QID) | RESPIRATORY_TRACT | Status: DC | PRN

## 2011-07-28 MED ORDER — LOSARTAN POTASSIUM 100 MG PO TABS: 100.0000 mg | ORAL_TABLET | Freq: Every day | ORAL | Status: DC

## 2011-07-28 MED ORDER — FLUTICASONE-SALMETEROL 250-50 MCG/DOSE IN AEPB: 1.0000 | INHALATION_SPRAY | Freq: Two times a day (BID) | RESPIRATORY_TRACT | Status: DC

## 2011-07-28 MED ORDER — DILTIAZEM HCL ER BEADS 360 MG PO CP24: 360.0000 mg | ORAL_CAPSULE | Freq: Every day | ORAL | Status: DC

## 2011-07-28 MED ORDER — LABETALOL HCL 200 MG PO TABS: ORAL_TABLET | ORAL | Status: DC

## 2011-07-28 MED ORDER — FUROSEMIDE 20 MG PO TABS: 20.0000 mg | ORAL_TABLET | Freq: Every day | ORAL | Status: DC

## 2011-07-28 MED ORDER — ATORVASTATIN CALCIUM 80 MG PO TABS: 80.0000 mg | ORAL_TABLET | Freq: Every day | ORAL | Status: DC

## 2011-07-28 NOTE — Progress Notes (Signed)
Subjective:    Patient ID: Monique Weber, female    DOB: [birth year], 55 y.o.   MRN: [id number]  HPI 55 y/o WF here for a follow up visit... she has multiple medical problems as noted below...    ~  Sep 23, 2009:  she's had a good 53mo- had some right chest wall trauma from a gate- pain & bruise, now resolved w/o residual... she has decr her Simvastatin to 40mg /d per her insurance company- problem is her last FLP wasn't at goal on 80mg /d & we discussed checking FLP on 40mg  before deciding on changing to another statin...  ~  March 26, 2010:  she's been stable over the last 53mo- no new complaints or concerns... her breathing has been doing well on Advair & Proair;  BP controlled on 4 med regimen below & no palpit etc;  Chol is not at goal on Simva40 & we discussed switch to Lipitor40mg /d;  she needs to work on weight reduction;  still under alot of stress but doing satis on Zoloft & Alpraz...   ~  Sep 17, 2010:  53mo ROV & she had some CP & HBP in the interim, went to [facility], sent to Cards & seen by DrHochrein w/ adjustment in meds & incr in Lipitor to 80mg /d;  She was unable to walk suffic on treadmill for GXT (poor exercise tolerance) & is supposed to ret for Myoview;  She has since stopped the Lisinopril "it made me off balance" (this was suggested by a [place] physician friend) & she is now doing "90d mindful meditation- it was in [place]";  BP currently satis on Labetolol, Diltiazem, & Lasix;  Her Simva40 was changed to Lip80 & FLP doesn't look much better> she is encouraged to get on a good low chol/ low fat diet, work on weight reduction, increase exercise program, etc;  She still sees DrMcKinney/ MBaker for her nerves on Zoloft, Xanax, Restoril...  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 Molli Knock re: energy therapy, shakra points, meditation, etc;  She has had a good interval w/o new complaints or concerns> see prob  list below:    Asthma> no problems over the last yr; she uses the Advair250Bid seasonally & reports that Autumn is the worst due to ragweed etc; also ha ProairHFA for prn use...    HBP> she notes her BP has been sl elev at home & measures 150/90 here today; on Labet200-2Bid, Diltiaz360/d, Lasix20/d; we discussed adding Losartan100/d as a trial and she will monitor BP at home; she knows to avoid caffeine etc w/ her hx of SVT.    Cardiac> seen by drHochrein 3/12 w/ AtypCP & HBP; she is upset about Myoview that cost her $2500 out of pocket & was neg    Chol> on Lip80 now (incr by DrHochrein) & FLP pending; last FLP 5/12 on ?Lip80? showed TChol 206, TG 109, HDL 80, LDL 113    Overweight> she is down 4# this yr to 194# "I've been juicing"; we reviewed diet, exercise, & wt reduction program...    GI> she reports stable; she is off her Prilosec20 but can use it prn symptoms; her last colon was 2007 & neg- f/u 2yrs.    GYN> followed by DrNeal & up to date; she reports that all is ok...    Ortho> hx bilat shoulder pain, neck pain, foot pain; she reports all better after Chiropractor  Rx & not requiring pain meds...    Anxiety> followed by DrMcKinney on Zoloft100, Xanax0.5-2Qhs, Ambien10 as needed;  Last CXR 11/10 showed borderline cardiomeg, clear lungs, irregularity at post part of left hemidiaph w/ sm defect & herniation of intra-abd fat (see 6/07 CTscan). EKG 3/12 showed NSR, rate75, axis within normal limits, intervals within normal limits, no acute ST-T wave changes... Nuclear Stress Test 3/12 was neg- no ischemia or infarction, EF=70%... LABS 3/13:  Pending...          Problem List:  ASTHMA (ICD-493.90) - on ADVAIR 250 Bid & PROAIR HFA Prn... prev on Accolate but was only taking it 1-2 per week therefore discontinued... hx asthmatic bronchitis w/o exac x yrs although incr difficulty in the spring... currently doing well- denies cough, sputum, hemoptysis, worsening dyspnea,  wheezing, chest pains,  snoring, daytime hypersomnolence, etc...  ~  CXR 11/10 showed borderline cardiomeg, clear lungs, sm left diaph defect...  HYPERTENSION (ICD-401.9) - on ASA 81mg /d,  LABETOLOL 200mg - 2Bid, DILTIAZEM ER 360mg /d, & LASIX 20mg /d> takes meds regularly & tol well...  ~  baseline EKG shows NSR, WNL... ~  2DEcho 11/06 was normal- EF= 55-65% & no regional wall motion abn. ~  She notes INTOL to Lisinopril stating it caused her to fall, balance prob, dizzy & anxious; feels much better off this med. ~  3/12:  She had treadmill exercise test showing poor exercise tolerance & was non-diagnostic; then Myoview==> neg w/o ischemia & EF=70% ~  5/12: BP = 136/78 & similar at home... denies HA, fatigue, visual changes, CP, palipit, dizziness, syncope, dyspnea, edema, etc...  ~  3/13: BP= 150/90 & we decided to add LOSARTAN 100mg /d as a trial...  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 no recent CP, palpit, etc...  HYPERCHOLESTEROLEMIA (ICD-272.0) - on SIMVASTATIN 40mg Qhs + FishOil...  ~  FLP 8/08 on Vytor10/40 showed TChol 264, TG 93, HDL 69, LDL 187... change to Simva80. ~  FLP 12/08 on Simva80 showed TChol 208, TG 78, HDL 61, LDL 139... continue med, better diet. ~  FLP 7/10 off med showed TChol 260, TG 85, HDL 96, LDL 156... rec> restart Simva80. ~  FLP 11/10 on Simva80 showed TChol 208, TG 89, HDL 80, LDL 130... she decr to Simva40 per Insurance [employer]. ~  FLP 5/11 on Simva40 showed TChol 227, TG 119, HDL 84, LDL 138 ~  FLP 11/11 on Simva40 showed TChol 233, TG 128, HDL 88, LDL 136... rec> ch to LIP40, later incr to LIP80 by Cards. ~  FLP 5/12 on Lip80 showed TChol 206, TG 109, HDL 80, LDL 113 ~  FLP 3/13 on Lip80 showed ==> pending  OVERWEIGHT (ICD-278.02) - we reviewed diet + exercise program necessary to lose weight. ~  weight 8/08 = 185# ~  weight 7/10 = 201# ~  weight 11/10 = 185# ~  weight 5/11 = 187# ~  weight 11/11 = 191# ~  Weight 5/12 =  198# ~  Weight 3/13 = 194#  Hx of GERD (ICD-530.81) - uses PRILOSEC OTC 20mg  Prn.  IRRITABLE BOWEL SYNDROME (ICD-564.1) - prev followed for GI by DrJohnson but she switched to [facility]... ~  colonoscopy 5/07 by DrJohnson was WNL.Marland Kitchen. she saw DrNat-Mann after that for post-colonoscopy pain & dx w/ IBS...  SHOULDER PAIN, BILATERAL (ICD-719.41) & STRESS FRACTURE, FOOT (ICD-733.94) - she's had right rotator cuff tear and eval by Ortho, then fall w/ injury to left shoulder... she sees chiropractor Prn w/ good  relief of symptoms.... s/p bilat stress fractures of the feet.  ANXIETY (ICD-300.00) & DEPRESSION (ICD-311) - on ALPRAZOLAM 0.5mg - takng 2Qhs, ZOLOFT 100mg /d, & AMBIEN 10mg Qhs prn... eval by DrMcKinney for Psychiatry & prev counselling as well...  Family Hx of MELANOMA (ICD-172.9) - both parents have hx malig melanoma removed- pt sees Derm Q55mo...  Health Maintenance:  GYN prev DrCollins, now DrNeal- on Premarin 0.625/ Prometrium (x12d every 3 mo)... also takes Calcium/ MVI/ Vit D 2000 u daily... she reports BMD in 2009 [facility]) w/ Osteopenia...   Past Surgical History  Procedure Date  . Dilation and curettage of uterus 1980    miscarriage 1980  . Cervical polypectomy 2005    Outpatient Encounter Prescriptions as of 07/28/2011  Medication Sig Dispense Refill  . ADVAIR DISKUS 250-50 MCG/DOSE AEPB USE ONE INHALATION BY MOUTH TWICE DAILY , RINSE MOUTH AFTER EACH USE  60 each  10  . ALPRAZolam (XANAX) 0.5 MG tablet Take 0.5 mg by mouth 2 (two) times daily as needed.       Marland Kitchen aspirin 81 MG tablet Take 81 mg by mouth daily.        Marland Kitchen atorvastatin (LIPITOR) 80 MG tablet TAKE 1 TABLET BY MOUTH EVERY DAY  30 tablet  5  . Calcium Carbonate-Vitamin D (CALCIUM 600+D) 600-400 MG-UNIT per tablet Take 1 tablet by mouth 2 (two) times daily.        . Cranberry Extract 200 MG CAPS Take 1 capsule by mouth daily.      Marland Kitchen estrogens, conjugated, (PREMARIN) 0.625 MG tablet Take 0.625 mg by mouth daily. Take  daily for 21 days then do not take for 7 days.       . fish oil-omega-3 fatty acids 1000 MG capsule Take 2 g by mouth daily.        . furosemide (LASIX) 20 MG tablet TAKE ONE TABLET BY MOUTH EVERY MORNING  90 tablet  0  . labetalol (NORMODYNE) 200 MG tablet Take 2 tablets by mouth twice a day  360 tablet  0  . Multiple Vitamins-Minerals (MULTIVITAMIN WITH MINERALS) tablet Take 1 tablet by mouth daily.        Marland Kitchen PROAIR HFA 108 (90 BASE) MCG/ACT inhaler USE 2 SPRAYS EVERY 6 HOURS AS NEEDED FOR WHEEZING  8.5 g  10  . progesterone (PROMETRIUM) 100 MG capsule Take once every three months for 12 days       . sertraline (ZOLOFT) 100 MG tablet Take 100 mg by mouth daily.       Marland Kitchen TAZTIA XT 360 MG 24 hr capsule TAKE 1 CAPSULE BY MOUTH DAILY  90 capsule  0  . temazepam (RESTORIL) 30 MG capsule Take 30 mg by mouth at bedtime as needed.        Marland Kitchen DISCONTD: Cholecalciferol (VITAMIN D) 2000 UNITS CAPS Take 1 capsule by mouth daily.        Marland Kitchen DISCONTD: ibuprofen (ADVIL,MOTRIN) 200 MG tablet Take 200 mg by mouth every 6 (six) hours as needed.          No Known Allergies   Current Medications, Allergies, Past Medical History, Past Surgical History, Family History, and Social History were reviewed in [employer] record.    Review of Systems       See HPI - all other systems neg except as noted... The patient denies anorexia, fever, weight loss, weight gain, vision loss, decreased hearing, hoarseness, chest pain, syncope, dyspnea on exertion, peripheral edema, prolonged cough, headaches, hemoptysis, abdominal pain,  melena, hematochezia, severe indigestion/heartburn, hematuria, incontinence, muscle weakness, suspicious skin lesions, transient blindness, difficulty walking, depression, unusual weight change, abnormal bleeding, enlarged lymph nodes, and angioedema.     Objective:   Physical Exam      WD, Overweight, 56 y/o WF in NAD... Vital signs:  Reviewed... GENERAL:  Alert & oriented;  pleasant & cooperative. HEENT:  [place]/AT, EOM-wnl, PERRLA, Fundi-benign, EACs-clear, TMs-wnl, NOSE-clear, THROAT-clear & wnl. NECK:  Supple w/ full ROM; no JVD; normal carotid impulses w/o bruits; no thyromegaly or nodules palpated; no lymphadenopathy. CHEST:  Clear to P & A; without wheezes/ rales/ or rhonchi. HEART:  Regular Rhythm; without murmurs/ rubs/ or gallops. ABDOMEN:  Soft & nontender; normal bowel sounds; no organomegaly or masses detected. EXT: without deformities or arthritic changes; no varicose veins/ venous insuffic/ or edema. NEURO:  CN's intact; motor testing normal; sensory testing normal; gait normal & balance OK. DERM:  No lesions noted; no rash etc...  RADIOLOGY DATA:  Reviewed in the EPIC EMR & discussed w/ the patient...  LABORATORY DATA:  Reviewed in the EPIC EMR & discussed w/ the patient...   Assessment & Plan:   ASTHMA>  Stable w/o exac, we prefer Rx w/ Advair regularly but she prefers seasonally only,  & Proair rescue as needed...  HBP>  BP seems not as well controlled; we reviewed diet + exercise; for now add LOSARTAN 100mg ...  CHOL>  Improved on the Lip80 but still not at goal> needs better diet & get wt down, same med for now...  Overwt>  Wt reduction is the key to improving the other parameters...  Anxiety>  She continues f/u w/ DrMcKinney & MBaker...   Patient's Medications  New Prescriptions   LOSARTAN (COZAAR) 100 MG TABLET    Take 1 tablet (100 mg total) by mouth daily.  Previous Medications   ALPRAZOLAM (XANAX) 0.5 MG TABLET    Take 0.5 mg by mouth 2 (two) times daily as needed.    ASPIRIN 81 MG TABLET    Take 81 mg by mouth daily.     CALCIUM CARBONATE-VITAMIN D (CALCIUM 600+D) 600-400 MG-UNIT PER TABLET    Take 1 tablet by mouth 2 (two) times daily.     CRANBERRY EXTRACT 200 MG CAPS    Take 1 capsule by mouth daily.   ESTROGENS, CONJUGATED, (PREMARIN) 0.625 MG TABLET    Take 0.625 mg by mouth daily. Take daily for 21 days then do not take for  7 days.    FISH OIL-OMEGA-3 FATTY ACIDS 1000 MG CAPSULE    Take 2 g by mouth daily.     MULTIPLE VITAMINS-MINERALS (MULTIVITAMIN WITH MINERALS) TABLET    Take 1 tablet by mouth daily.     PROGESTERONE (PROMETRIUM) 100 MG CAPSULE    Take once every three months for 12 days    SERTRALINE (ZOLOFT) 100 MG TABLET    Take 100 mg by mouth daily.    TEMAZEPAM (RESTORIL) 30 MG CAPSULE    Take 30 mg by mouth at bedtime as needed.    Modified Medications   Modified Medication Previous Medication   ALBUTEROL (PROAIR HFA) 108 (90 BASE) MCG/ACT INHALER PROAIR HFA 108 (90 BASE) MCG/ACT inhaler      Inhale 2 puffs into the lungs every 6 (six) hours as needed for wheezing.    USE 2 SPRAYS EVERY 6 HOURS AS NEEDED FOR WHEEZING   ATORVASTATIN (LIPITOR) 80 MG TABLET atorvastatin (LIPITOR) 80 MG tablet      Take 1 tablet (80 mg  total) by mouth daily.    TAKE 1 TABLET BY MOUTH EVERY DAY   DILTIAZEM (TAZTIA XT) 360 MG 24 HR CAPSULE TAZTIA XT 360 MG 24 hr capsule      Take 1 capsule (360 mg total) by mouth daily.    TAKE 1 CAPSULE BY MOUTH DAILY   FLUTICASONE-SALMETEROL (ADVAIR DISKUS) 250-50 MCG/DOSE AEPB ADVAIR DISKUS 250-50 MCG/DOSE AEPB      Inhale 1 puff into the lungs 2 (two) times daily. Rinse mouth after each use    USE ONE INHALATION BY MOUTH TWICE DAILY , RINSE MOUTH AFTER EACH USE   FUROSEMIDE (LASIX) 20 MG TABLET furosemide (LASIX) 20 MG tablet      Take 1 tablet (20 mg total) by mouth daily.    TAKE ONE TABLET BY MOUTH EVERY MORNING   LABETALOL (NORMODYNE) 200 MG TABLET labetalol (NORMODYNE) 200 MG tablet      Take 2 tablets by mouth twice a day    Take 2 tablets by mouth twice a day  Discontinued Medications   CHOLECALCIFEROL (VITAMIN D) 2000 UNITS CAPS    Take 1 capsule by mouth daily.     IBUPROFEN (ADVIL,MOTRIN) 200 MG TABLET    Take 200 mg by mouth every 6 (six) hours as needed.

## 2011-07-28 NOTE — Patient Instructions (Signed)
Today we updated your med list in our EPIC system...    Continue your current medications the same...    We refilled your meds per request...    We decided to add LOSARTAN 100mg - one tab daily yo your BP regimen...       (hopefully we will be able to decr meds as your weight improves on your diet & exercise program).  Please return to our lab for your follow up FASTING blood work at [facility]...    We will call you w/ the results when avail...  Call for any problems...    Please monitor your BP at home...  Let's plan a follow up visit in 6 months, sooner if needed for any reason.Marland KitchenMarland Kitchen

## 2011-09-08 ENCOUNTER — Other Ambulatory Visit (INDEPENDENT_AMBULATORY_CARE_PROVIDER_SITE_OTHER): Payer: BC Managed Care – PPO

## 2011-09-08 DIAGNOSIS — K589 Irritable bowel syndrome without diarrhea: Secondary | ICD-10-CM

## 2011-09-08 DIAGNOSIS — I10 Essential (primary) hypertension: Secondary | ICD-10-CM

## 2011-09-08 DIAGNOSIS — F411 Generalized anxiety disorder: Secondary | ICD-10-CM

## 2011-09-08 DIAGNOSIS — E7800 Pure hypercholesterolemia, unspecified: Secondary | ICD-10-CM

## 2011-09-08 LAB — LIPID PANEL
Cholesterol: 191 mg/dL (ref 0–200)
HDL: 80.4 mg/dL
LDL Cholesterol: 89 mg/dL (ref 0–99)
Total CHOL/HDL Ratio: 2
Triglycerides: 109 mg/dL (ref 0.0–149.0)
VLDL: 21.8 mg/dL (ref 0.0–40.0)

## 2011-09-08 LAB — CBC WITH DIFFERENTIAL/PLATELET
Basophils Absolute: 0.1 10*3/uL (ref 0.0–0.1)
Basophils Relative: 0.8 % (ref 0.0–3.0)
Eosinophils Relative: 2.8 % (ref 0.0–5.0)
Hemoglobin: 13.3 g/dL (ref 12.0–15.0)
Lymphocytes Relative: 20.2 % (ref 12.0–46.0)
Monocytes Relative: 5 % (ref 3.0–12.0)
Neutro Abs: 5.6 10*3/uL (ref 1.4–7.7)
RBC: 4.17 Mil/uL (ref 3.87–5.11)
RDW: 13.9 % (ref 11.5–14.6)
WBC: 7.9 10*3/uL (ref 4.5–10.5)

## 2011-09-08 LAB — BASIC METABOLIC PANEL
BUN: 16 mg/dL (ref 6–23)
CO2: 31 mEq/L (ref 19–32)
Calcium: 9.1 mg/dL (ref 8.4–10.5)
Creatinine, Ser: 0.7 mg/dL (ref 0.4–1.2)
Glucose, Bld: 97 mg/dL (ref 70–99)

## 2011-09-08 LAB — HEPATIC FUNCTION PANEL
Albumin: 3.8 g/dL (ref 3.5–5.2)
Total Protein: 6.8 g/dL (ref 6.0–8.3)

## 2011-10-07 ENCOUNTER — Other Ambulatory Visit: Payer: Self-pay | Admitting: Cardiology

## 2011-10-20 ENCOUNTER — Other Ambulatory Visit: Payer: Self-pay | Admitting: Pulmonary Disease

## 2011-11-27 ENCOUNTER — Telehealth: Payer: Self-pay | Admitting: Pulmonary Disease

## 2011-11-27 NOTE — Telephone Encounter (Signed)
Called and spoke with pt and she stated that she has been having bil ankle pain x 1 month with a small amount of swelling.  More painful on the outside of both ankles esp to the touch. Pt has seen Dr. Cleophas Dunker in the past.   Pt also c/o left ear discomfort---no pain but feels like she is in a pool.  Pt stated that she would prefer to NOT see an ENT due to past ov with one of them.  SN please advise. Thanks  No Known Allergies

## 2011-11-27 NOTE — Telephone Encounter (Signed)
Called and left message with pts husband to call us back.

## 2011-12-02 NOTE — Telephone Encounter (Signed)
LMOMTCB x1 for pt 

## 2011-12-02 NOTE — Telephone Encounter (Signed)
Per SN: for legs > no salt, elevate legs, support hose, use aleve prn pain.  For ear > call in cortisporin otic drops, 2 drops in affected ear tid #1 bottle w/ no refill.  Thanks.

## 2011-12-03 MED ORDER — NEOMYCIN-POLYMYXIN-HC 1 % OT SOLN: OTIC | Status: DC

## 2011-12-03 NOTE — Telephone Encounter (Signed)
Patient made aware of SN recs. RX sent to her pharmacy. She will call if her symptoms do not improve or seek emergency help if the symptoms get worse.

## 2012-01-03 ENCOUNTER — Ambulatory Visit (INDEPENDENT_AMBULATORY_CARE_PROVIDER_SITE_OTHER): Payer: BC Managed Care – PPO | Admitting: Family Medicine

## 2012-01-03 VITALS — BP 133/77 | HR 61 | Temp 98.4°F | Resp 16 | Ht 65.0 in | Wt 186.0 lb

## 2012-01-03 DIAGNOSIS — N644 Mastodynia: Secondary | ICD-10-CM

## 2012-01-03 DIAGNOSIS — N61 Mastitis without abscess: Secondary | ICD-10-CM

## 2012-01-03 DIAGNOSIS — N611 Abscess of the breast and nipple: Secondary | ICD-10-CM

## 2012-01-03 MED ORDER — CEPHALEXIN 500 MG PO CAPS: 500.0000 mg | ORAL_CAPSULE | Freq: Three times a day (TID) | ORAL | Status: AC

## 2012-01-03 MED ORDER — SULFAMETHOXAZOLE-TRIMETHOPRIM 800-160 MG PO TABS: 2.0000 | ORAL_TABLET | Freq: Two times a day (BID) | ORAL | Status: AC

## 2012-01-03 NOTE — Progress Notes (Signed)
Subjective:    Patient ID: Monique Weber, female    DOB: Nov 09, 1955, 56 y.o.   MRN: [id number]  HPIThis 56 y.o. female presents for evaluation of boil between breasts.  Onset four days ago and noticed a horrible smell initially.  Changed clothes and still smelled odor.  Noticed a weeping area under breast.  Applying peroxide to area and removing drainage; enormous amount of pus without blood.  Cleansed area with improvement.  The following day, had two large lumps on either side of drainage.  Areas enlarged; applied hot compresses.  Got betadine and cleansed; applied topical antibacterial ointment.  Kept bandaged.  Now has redness that is spreading.  Daughter is a Engineer, civil (consulting) and worried about spider bite; pt not worried about spider bite.  Still draining.  Last night, applied cloves, cinnamon, sterile gauze with improvement.   No fever no chills/sweats.  No diabetes.  +fatigued.  No nausea.  Similar symptoms in past on R breast, R buttocks, L lower region over past 20 years.  No history of MRSA but daughter is Engineer, civil (consulting).     Review of Systems  Constitutional: Negative for fever, chills and fatigue.  Gastrointestinal: Negative for nausea, vomiting and abdominal pain.  Musculoskeletal: Negative for myalgias.  Skin: Positive for color change, rash and wound. Negative for pallor.    Past Medical History  Diagnosis Date  . Unspecified asthma   . Unspecified essential hypertension   . Other specified cardiac dysrhythmias   . Pure hypercholesterolemia   . Overweight   . Esophageal reflux   . Irritable bowel syndrome   . Pain in joint, shoulder region   . Stress fracture of the metatarsals   . Disorder of bone and cartilage, unspecified   . Anxiety state, unspecified   . Depressive disorder, not elsewhere classified   . Melanoma of skin, site unspecified     Past Surgical History  Procedure Date  . Dilation and curettage of uterus 1980    miscarriage 1980  . Cervical polypectomy 2005    Prior  to Admission medications   Medication Sig Start Date End Date Taking? Authorizing Provider  albuterol (PROAIR HFA) 108 (90 BASE) MCG/ACT inhaler Inhale 2 puffs into the lungs every 6 (six) hours as needed for wheezing. 07/28/11  Yes Michele Mcalpine, MD  ALPRAZolam Prudy Feeler) 0.5 MG tablet Take 0.5 mg by mouth 2 (two) times daily as needed.    Yes Historical Provider, MD  aspirin 81 MG tablet Take 81 mg by mouth daily.     Yes Historical Provider, MD  atorvastatin (LIPITOR) 80 MG tablet Take 1 tablet (80 mg total) by mouth daily. 07/28/11  Yes Michele Mcalpine, MD  Calcium Carbonate-Vitamin D (CALCIUM 600+D) 600-400 MG-UNIT per tablet Take 1 tablet by mouth 2 (two) times daily.     Yes Historical Provider, MD  Cranberry Extract 200 MG CAPS Take 1 capsule by mouth daily.   Yes Historical Provider, MD  diltiazem (TAZTIA XT) 360 MG 24 hr capsule Take 1 capsule (360 mg total) by mouth daily. 07/28/11  Yes Michele Mcalpine, MD  estrogens, conjugated, (PREMARIN) 0.625 MG tablet Take 0.625 mg by mouth daily. Take daily for 21 days then do not take for 7 days.    Yes Historical Provider, MD  fish oil-omega-3 fatty acids 1000 MG capsule Take 2 g by mouth daily.     Yes Historical Provider, MD  Fluticasone-Salmeterol (ADVAIR DISKUS) 250-50 MCG/DOSE AEPB Inhale 1 puff into the lungs 2 (two) times  daily. Rinse mouth after each use 07/28/11 07/27/12 Yes Michele Mcalpine, MD  furosemide (LASIX) 20 MG tablet Take 1 tablet (20 mg total) by mouth daily. 07/28/11  Yes Michele Mcalpine, MD  labetalol (NORMODYNE) 200 MG tablet Take 2 tablets by mouth twice a day 07/28/11 07/27/12 Yes Michele Mcalpine, MD  losartan (COZAAR) 100 MG tablet Take 1 tablet (100 mg total) by mouth daily. 07/28/11 07/27/12 Yes Michele Mcalpine, MD  Multiple Vitamins-Minerals (MULTIVITAMIN WITH MINERALS) tablet Take 1 tablet by mouth daily.     Yes Historical Provider, MD  progesterone (PROMETRIUM) 100 MG capsule Take once every three months for 12 days    Yes Historical  Provider, MD  sertraline (ZOLOFT) 100 MG tablet Take 100 mg by mouth daily.    Yes Historical Provider, MD  temazepam (RESTORIL) 30 MG capsule Take 30 mg by mouth at bedtime as needed.     Yes Historical Provider, MD    No Known Allergies  History   Social History  . Marital Status: Married    Spouse Name: N/A    Number of Children: 3  . Years of Education: N/A   Occupational History  . student     getting masters in liberal arts   Social History Main Topics  . Smoking status: Former Games developer  . Smokeless tobacco: Not on file   Comment: off and on since 1991  . Alcohol Use: Yes     social use  . Drug Use: Not on file  . Sexually Active: Not on file   Other Topics Concern  . Not on file   Social History Narrative  . No narrative on file    Family History  Problem Relation Age of Onset  . Heart disease Father   . Melanoma Father   . Hypertension Mother   . Melanoma Mother   . Arthritis Mother     DJD  . Other Mother     Hashimoto's Encephalopathy       Objective:   Physical Exam  Constitutional: She appears well-developed and well-nourished. No distress.  HENT:  Head: Normocephalic and atraumatic.  Eyes: Conjunctivae and EOM are normal. Pupils are equal, round, and reactive to light.  Neck: Normal range of motion. Neck supple.  Cardiovascular: Normal rate, regular rhythm and normal heart sounds.   Pulmonary/Chest: Effort normal and breath sounds normal. Left breast exhibits no mass, no nipple discharge, no skin change and no tenderness. Breasts are symmetrical.  Lymphadenopathy:    She has no cervical adenopathy.       Left axillary: No pectoral and no lateral adenopathy present. Skin: No abrasion, no bruising, no burn and no ecchymosis noted. Rash is not papular, not maculopapular and not vesicular. She is not diaphoretic. There is erythema.       L BREAST:  MEDIAL INFERIOR BREAST AT 7 O'CLOCK WITH ERYTHEMA, INDURATION, SMALL PUSTULAR OPENING WITH SCANT  DRAINAGE.  MILDLY TTP.  AREA 6 CM X 5 CM; NO FLUCTUANTS.       Assessment & Plan:   1. Breast pain    2. Abscess of skin of breast  cephALEXin (KEFLEX) 500 MG capsule, sulfamethoxazole-trimethoprim (BACTRIM DS,SEPTRA DS) 800-160 MG per tablet, Wound culture    1.  Breast Pain L: New.  Secondary to cellulitis/abscess. 2.  Abscess/Cellulitis L lower breast:  New.  S/p spontaneous drainage; wound culture obtained of scant drainage; treat with Keflex and Bactrim. Continue warm compresses, washing daily with soap and water.  RTC  for development of fever, increasing pain, increasing redness.    Meds ordered this encounter  Medications  . cephALEXin (KEFLEX) 500 MG capsule    Sig: Take 1 capsule (500 mg total) by mouth 3 (three) times daily.    Dispense:  30 capsule    Refill:  0  . sulfamethoxazole-trimethoprim (BACTRIM DS,SEPTRA DS) 800-160 MG per tablet    Sig: Take 2 tablets by mouth 2 (two) times daily.    Dispense:  40 tablet    Refill:  0

## 2012-01-03 NOTE — Patient Instructions (Addendum)
1. Breast pain    2. Abscess of skin of breast  cephALEXin (KEFLEX) 500 MG capsule, sulfamethoxazole-trimethoprim (BACTRIM DS,SEPTRA DS) 800-160 MG per tablet   Abscess An abscess (boil or furuncle) is an infected area that contains a collection of pus.  SYMPTOMS Signs and symptoms of an abscess include pain, tenderness, redness, or hardness. You may feel a moveable soft area under your skin. An abscess can occur anywhere in the body.  TREATMENT  A surgical cut (incision) may be made over your abscess to drain the pus. Gauze may be packed into the space or a drain may be looped through the abscess cavity (pocket). This provides a drain that will allow the cavity to heal from the inside outwards. The abscess may be painful for a few days, but should feel much better if it was drained.  Your abscess, if seen early, may not have localized and may not have been drained. If not, another appointment may be required if it does not get better on its own or with medications. HOME CARE INSTRUCTIONS   Only take over-the-counter or prescription medicines for pain, discomfort, or fever as directed by your caregiver.   Take your antibiotics as directed if they were prescribed. Finish them even if you start to feel better.   Keep the skin and clothes clean around your abscess.   If the abscess was drained, you will need to use gauze dressing to collect any draining pus. Dressings will typically need to be changed 3 or more times a day.   The infection may spread by skin contact with others. Avoid skin contact as much as possible.   Practice good hygiene. This includes regular hand washing, cover any draining skin lesions, and do not share personal care items.   If you participate in sports, do not share athletic equipment, towels, whirlpools, or personal care items. Shower after every practice or tournament.   If a draining area cannot be adequately covered:   Do not participate in sports.   Children  should not participate in day care until the wound has healed or drainage stops.   If your caregiver has given you a follow-up appointment, it is very important to keep that appointment. Not keeping the appointment could result in a much worse infection, chronic or permanent injury, pain, and disability. If there is any problem keeping the appointment, you must call back to this facility for assistance.  SEEK MEDICAL CARE IF:   You develop increased pain, swelling, redness, drainage, or bleeding in the wound site.   You develop signs of generalized infection including muscle aches, chills, fever, or a general ill feeling.   You have an oral temperature above 102 F (38.9 C).  MAKE SURE YOU:   Understand these instructions.   Will watch your condition.   Will get help right away if you are not doing well or get worse.  Document Released: 02/04/2005 Document Revised: 04/16/2011 Document Reviewed: 11/29/2007 [facility] Patient Information 2012 [place], [place].

## 2012-01-04 ENCOUNTER — Other Ambulatory Visit: Payer: Self-pay | Admitting: Pulmonary Disease

## 2012-01-06 ENCOUNTER — Ambulatory Visit (INDEPENDENT_AMBULATORY_CARE_PROVIDER_SITE_OTHER): Payer: BC Managed Care – PPO | Admitting: Family Medicine

## 2012-01-06 ENCOUNTER — Telehealth: Payer: Self-pay

## 2012-01-06 VITALS — BP 143/82 | HR 62 | Temp 98.5°F | Resp 17 | Ht 65.5 in | Wt 183.0 lb

## 2012-01-06 DIAGNOSIS — L0291 Cutaneous abscess, unspecified: Secondary | ICD-10-CM

## 2012-01-06 DIAGNOSIS — L0390 Cellulitis, unspecified: Secondary | ICD-10-CM

## 2012-01-06 LAB — WOUND CULTURE
Gram Stain: NONE SEEN
Gram Stain: NONE SEEN

## 2012-01-06 NOTE — Telephone Encounter (Signed)
Prelim report is no growth, I have advised patient the final report not in yet but takes 2-3 days for final. She is getting worse, I advised her may be more than one area and may need more treatment she is coming in today/ Monique Weber

## 2012-01-06 NOTE — Telephone Encounter (Signed)
PATIENT STATES SHE SAW DR. Katrinka Blazing ON Sunday FOR BREAST RASH. SHE SAYS IT IS REALLY NOT GETTING ANY BETTER. IT IS SPREADING AND STILL HOT & RED. THE DRAINAGE IS ONLY A SMALL AMOUNT. SHE IS GOING OUT OF TOWN Thursday AND WOULD REALLY LIKE TO GET HER CULTURE RESULTS. SHE WANTS TO KNOW IF THIS IS A STAPH INFECTION OR MAYBE MRSA?  BEST PHONE [phone] (HOME)  PHARMACY CHOICE IS [facility] ON [address].   MBC

## 2012-01-06 NOTE — Progress Notes (Signed)
[facility] [address], [place] [address] [phone]  Date:  01/06/2012   Name:  Monique Weber   DOB:  [birth year]   MRN:  [id number]  PCP:  Michele Mcalpine, MD    Chief Complaint: Rash   History of Present Illness:  Tameaka Eichhorn is a 56 y.o. very pleasant female patient who presents with the following:  Here today to recheck a boil- see OV here 01/03/12.  She was started on keflex and septra- the area was not amenable to I and D at that time.  She is concerned because the area has not drained much, and she stil does not feel very well. The area is still tender.  She does not have a fever or any other acute symptoms such as nausea/ vomiting  Patient Active Problem List  Diagnosis  . HYPERCHOLESTEROLEMIA  . OVERWEIGHT  . ANXIETY  . DEPRESSION  . HYPERTENSION  . SUPRAVENTRICULAR TACHYCARDIA  . ASTHMA  . GERD  . IRRITABLE BOWEL SYNDROME  . SHOULDER PAIN, BILATERAL  . NECK PAIN  . OSTEOPENIA  . STRESS FRACTURE, FOOT  . HEADACHE  . GROIN PAIN  . CHEST PAIN, PRECORDIAL    Past Medical History  Diagnosis Date  . Unspecified asthma   . Unspecified essential hypertension   . Other specified cardiac dysrhythmias   . Pure hypercholesterolemia   . Overweight   . Esophageal reflux   . Irritable bowel syndrome   . Pain in joint, shoulder region   . Stress fracture of the metatarsals   . Disorder of bone and cartilage, unspecified   . Anxiety state, unspecified   . Depressive disorder, not elsewhere classified   . Melanoma of skin, site unspecified     Past Surgical History  Procedure Date  . Dilation and curettage of uterus 1980    miscarriage 1980  . Cervical polypectomy 2005    History  Substance Use Topics  . Smoking status: Former Games developer  . Smokeless tobacco: Not on file   Comment: off and on since 1991  . Alcohol Use: Yes     social use    Family History  Problem Relation Age of Onset  . Heart disease Father   . Melanoma  Father   . Hypertension Mother   . Melanoma Mother   . Arthritis Mother     DJD  . Other Mother     Hashimoto's Encephalopathy    No Known Allergies  Medication list has been reviewed and updated.  Current Outpatient Prescriptions on File Prior to Visit  Medication Sig Dispense Refill  . albuterol (PROAIR HFA) 108 (90 BASE) MCG/ACT inhaler Inhale 2 puffs into the lungs every 6 (six) hours as needed for wheezing.  3 Inhaler  3  . ALPRAZolam (XANAX) 0.5 MG tablet Take 0.5 mg by mouth 2 (two) times daily as needed.       Marland Kitchen aspirin 81 MG tablet Take 81 mg by mouth daily.        Marland Kitchen atorvastatin (LIPITOR) 80 MG tablet Take 1 tablet (80 mg total) by mouth daily.  90 tablet  3  . Calcium Carbonate-Vitamin D (CALCIUM 600+D) 600-400 MG-UNIT per tablet Take 1 tablet by mouth 2 (two) times daily.        . cephALEXin (KEFLEX) 500 MG capsule Take 1 capsule (500 mg total) by mouth 3 (three) times daily.  30 capsule  0  . Cranberry Extract 200 MG CAPS Take 1 capsule by  mouth daily.      Marland Kitchen diltiazem (TAZTIA XT) 360 MG 24 hr capsule Take 1 capsule (360 mg total) by mouth daily.  90 capsule  3  . estrogens, conjugated, (PREMARIN) 0.625 MG tablet Take 0.625 mg by mouth daily. Take daily for 21 days then do not take for 7 days.       . fish oil-omega-3 fatty acids 1000 MG capsule Take 2 g by mouth daily.        . Fluticasone-Salmeterol (ADVAIR DISKUS) 250-50 MCG/DOSE AEPB Inhale 1 puff into the lungs 2 (two) times daily. Rinse mouth after each use  180 each  3  . furosemide (LASIX) 20 MG tablet Take 1 tablet (20 mg total) by mouth daily.  90 tablet  3  . labetalol (NORMODYNE) 200 MG tablet Take 2 tablets by mouth twice a day  360 tablet  3  . losartan (COZAAR) 100 MG tablet Take 1 tablet (100 mg total) by mouth daily.  90 tablet  3  . Multiple Vitamins-Minerals (MULTIVITAMIN WITH MINERALS) tablet Take 1 tablet by mouth daily.        . progesterone (PROMETRIUM) 100 MG capsule Take once every three months for  12 days       . sertraline (ZOLOFT) 100 MG tablet Take 100 mg by mouth daily.       Marland Kitchen sulfamethoxazole-trimethoprim (BACTRIM DS,SEPTRA DS) 800-160 MG per tablet Take 2 tablets by mouth 2 (two) times daily.  40 tablet  0  . TAZTIA XT 360 MG 24 hr capsule TAKE 1 CAPSULE BY MOUTH DAILY  90 capsule  0  . temazepam (RESTORIL) 30 MG capsule Take 30 mg by mouth at bedtime as needed.          Review of Systems:  As per HPI- otherwise negative.   Physical Examination: Filed Vitals:   01/06/12 1143  BP: 143/82  Pulse: 62  Temp: 98.5 F (36.9 C)  Resp: 17   Filed Vitals:   01/06/12 1143  Height: 5' 5.5" (1.664 m)  Weight: 183 lb (83.008 kg)   Body mass index is 29.99 kg/(m^2). Ideal Body Weight: Weight in (lb) to have BMI = 25: 152.2   GEN: WDWN, NAD, Non-toxic, A & O x 3, overweight HEENT: Atraumatic, Normocephalic. Neck supple. No masses, No LAD. Ears and Nose: No external deformity. CV: RRR, No M/G/R. No JVD. No thrill. No extra heart sounds. PULM: CTA B, no wheezes, crackles, rhonchi. No retractions. No resp. distress. No accessory muscle use. Chest wall: under left breast there is a firm, indurated and sore area about 2 X 5 cm.  There is no fluctuance or pointing, there is a small area where purulent discharge can be expressed.   EXTR: No c/c/e NEURO Normal gait.  PSYCH: Normally interactive. Conversant. Not depressed or anxious appearing.  Calm demeanor.    Assessment and Plan: 1. Abscess    Musette's abscess/ cellulitis persists.  However, I do not see a good area to I and D and do not think that we will get any pus.  She will continue aggressive hot compresses and her abx.  She will return if still no better in a couple of days for possible I and D.  No charge today.    Abbe Amsterdam, MD

## 2012-01-06 NOTE — Progress Notes (Signed)
Reviewed and agree.

## 2012-01-07 ENCOUNTER — Telehealth: Payer: Self-pay

## 2012-01-07 NOTE — Telephone Encounter (Signed)
Pt called wanting to know if lab results were back. Please call pt back when received  [phone], of no answer may leave msg. Pt is out of town right now.

## 2012-01-08 NOTE — Telephone Encounter (Signed)
Pt advised that wound cx was negative, as discussed in last OV, she will continue taking the bactrim as directed by DR Katrinka Blazing.  Pt understood.

## 2012-06-04 ENCOUNTER — Other Ambulatory Visit: Payer: Self-pay | Admitting: Pulmonary Disease

## 2012-06-06 NOTE — Telephone Encounter (Signed)
Received electronic refill request for advair 250-21mcg.  Pt last seen 07/2011, rec'd to follow up in 6 months.  No appt scheduled/pending.  Refills x2 given w/ note to pharmacy pt needs appt for refills.

## 2012-06-15 ENCOUNTER — Other Ambulatory Visit: Payer: Self-pay | Admitting: Pulmonary Disease

## 2012-06-15 MED ORDER — ATORVASTATIN CALCIUM 80 MG PO TABS: 80.0000 mg | ORAL_TABLET | Freq: Every day | ORAL | Status: DC

## 2012-06-15 MED ORDER — FUROSEMIDE 20 MG PO TABS: 20.0000 mg | ORAL_TABLET | Freq: Every day | ORAL | Status: DC

## 2012-06-15 MED ORDER — LOSARTAN POTASSIUM 100 MG PO TABS: 100.0000 mg | ORAL_TABLET | Freq: Every day | ORAL | Status: DC

## 2012-07-29 ENCOUNTER — Other Ambulatory Visit: Payer: Self-pay | Admitting: Pulmonary Disease

## 2012-07-31 ENCOUNTER — Other Ambulatory Visit: Payer: Self-pay | Admitting: Pulmonary Disease

## 2012-08-03 ENCOUNTER — Other Ambulatory Visit: Payer: Self-pay | Admitting: Pulmonary Disease

## 2012-08-03 MED ORDER — FUROSEMIDE 20 MG PO TABS: 20.0000 mg | ORAL_TABLET | Freq: Every day | ORAL | Status: DC

## 2012-08-03 MED ORDER — LABETALOL HCL 200 MG PO TABS: ORAL_TABLET | ORAL | Status: DC

## 2012-08-26 ENCOUNTER — Other Ambulatory Visit: Payer: Self-pay | Admitting: Pulmonary Disease

## 2012-09-07 ENCOUNTER — Other Ambulatory Visit: Payer: Self-pay | Admitting: Pulmonary Disease

## 2012-09-10 ENCOUNTER — Other Ambulatory Visit: Payer: Self-pay | Admitting: Pulmonary Disease

## 2012-09-20 ENCOUNTER — Other Ambulatory Visit: Payer: Self-pay | Admitting: Pulmonary Disease

## 2012-10-16 ENCOUNTER — Other Ambulatory Visit: Payer: Self-pay | Admitting: Pulmonary Disease

## 2012-10-18 ENCOUNTER — Other Ambulatory Visit: Payer: Self-pay | Admitting: Pulmonary Disease

## 2012-11-18 ENCOUNTER — Other Ambulatory Visit: Payer: Self-pay | Admitting: Pulmonary Disease

## 2012-11-20 ENCOUNTER — Other Ambulatory Visit: Payer: Self-pay | Admitting: Pulmonary Disease

## 2012-11-24 ENCOUNTER — Other Ambulatory Visit: Payer: Self-pay | Admitting: Pulmonary Disease

## 2012-11-25 ENCOUNTER — Telehealth: Payer: Self-pay | Admitting: Pulmonary Disease

## 2012-11-25 NOTE — Telephone Encounter (Signed)
I spoke with the pt and she is coming in to see TP next Friday for a visit and wants to come in for labs before. She is requesting to have all the "regular labs" order like cholesterol, liver and kidney function, but also a CBC, vitamin b-12 and vitamin d levels as well. She states she has been having a lot of vertigo and wants to have labs to see what is causing this. Please advise Carron Curie, CMA

## 2012-11-29 NOTE — Telephone Encounter (Signed)
Discussed with Monique Weber Pt has not seen SN since 3.19.13 and is scheduled for acute visit with TP 7.25.14 Pt will need ov with SN and can have physical labs done at that time  Called spoke with patient, advised of the above.  SN with openings tomorrow > appt cancelled w/ TP and scheduled w/ SN for 4pm 7.23.14.  Nothing further needed; will sign off.

## 2012-11-30 ENCOUNTER — Ambulatory Visit (INDEPENDENT_AMBULATORY_CARE_PROVIDER_SITE_OTHER): Payer: BC Managed Care – PPO | Admitting: Pulmonary Disease

## 2012-11-30 ENCOUNTER — Encounter: Payer: Self-pay | Admitting: Pulmonary Disease

## 2012-11-30 ENCOUNTER — Ambulatory Visit (INDEPENDENT_AMBULATORY_CARE_PROVIDER_SITE_OTHER)
Admission: RE | Admit: 2012-11-30 | Discharge: 2012-11-30 | Disposition: A | Discharge status: Home / Self Care | Payer: BC Managed Care – PPO | Source: Ambulatory Visit | Attending: Pulmonary Disease | Admitting: Pulmonary Disease

## 2012-11-30 VITALS — BP 140/80 | HR 64 | Temp 99.2°F | Ht 66.0 in | Wt 195.4 lb

## 2012-11-30 DIAGNOSIS — F411 Generalized anxiety disorder: Secondary | ICD-10-CM

## 2012-11-30 DIAGNOSIS — E663 Overweight: Secondary | ICD-10-CM

## 2012-11-30 DIAGNOSIS — I10 Essential (primary) hypertension: Secondary | ICD-10-CM

## 2012-11-30 DIAGNOSIS — J45909 Unspecified asthma, uncomplicated: Secondary | ICD-10-CM

## 2012-11-30 DIAGNOSIS — Z0000 Encounter for general adult medical examination without abnormal findings: Secondary | ICD-10-CM

## 2012-11-30 DIAGNOSIS — K589 Irritable bowel syndrome without diarrhea: Secondary | ICD-10-CM

## 2012-11-30 DIAGNOSIS — M899 Disorder of bone, unspecified: Secondary | ICD-10-CM

## 2012-11-30 DIAGNOSIS — K219 Gastro-esophageal reflux disease without esophagitis: Secondary | ICD-10-CM

## 2012-11-30 DIAGNOSIS — F329 Major depressive disorder, single episode, unspecified: Secondary | ICD-10-CM

## 2012-11-30 DIAGNOSIS — M949 Disorder of cartilage, unspecified: Secondary | ICD-10-CM

## 2012-11-30 DIAGNOSIS — E7800 Pure hypercholesterolemia, unspecified: Secondary | ICD-10-CM

## 2012-11-30 NOTE — Patient Instructions (Addendum)
Today we updated your med list in our EPIC system...    Continue your current medications the same...  Today we did your follow up CXR... Please return to our lab one morning soon for your FASTING blood work...    We will contact you w/ the results when available...   Let's get on track w/ our diet & exercise program...  Call for any questions...  Let's plan a follow up visit in 26yr, sooner if needed for problems.Marland KitchenMarland Kitchen

## 2012-12-01 ENCOUNTER — Other Ambulatory Visit (INDEPENDENT_AMBULATORY_CARE_PROVIDER_SITE_OTHER): Payer: BC Managed Care – PPO

## 2012-12-01 DIAGNOSIS — Z0000 Encounter for general adult medical examination without abnormal findings: Secondary | ICD-10-CM

## 2012-12-01 LAB — CBC WITH DIFFERENTIAL/PLATELET
Basophils Relative: 0.5 % (ref 0.0–3.0)
Eosinophils Relative: 4.3 % (ref 0.0–5.0)
HCT: 38.5 % (ref 36.0–46.0)
Lymphs Abs: 1.9 10*3/uL (ref 0.7–4.0)
Monocytes Relative: 5.7 % (ref 3.0–12.0)
Neutrophils Relative %: 64.5 % (ref 43.0–77.0)
Platelets: 333 10*3/uL (ref 150.0–400.0)
RBC: 4.11 Mil/uL (ref 3.87–5.11)
WBC: 7.5 10*3/uL (ref 4.5–10.5)

## 2012-12-01 LAB — LIPID PANEL
Cholesterol: 210 mg/dL — ABNORMAL HIGH (ref 0–200)
HDL: 88.2 mg/dL (ref >39.00)
Triglycerides: 117 mg/dL (ref 0.0–149.0)
VLDL: 23.4 mg/dL (ref 0.0–40.0)

## 2012-12-01 LAB — ANGIOTENSIN CONVERTING ENZYME: Angiotensin-Converting Enzyme: 18 U/L (ref 8–52)

## 2012-12-01 LAB — BASIC METABOLIC PANEL
BUN: 14 mg/dL (ref 6–23)
Creatinine, Ser: 0.7 mg/dL (ref 0.4–1.2)
GFR: 85.73 mL/min (ref >60.00)
Glucose, Bld: 104 mg/dL — ABNORMAL HIGH (ref 70–99)
Potassium: 4.4 mEq/L (ref 3.5–5.1)

## 2012-12-01 LAB — HEPATIC FUNCTION PANEL
AST: 29 U/L (ref 0–37)
Albumin: 4.1 g/dL (ref 3.5–5.2)

## 2012-12-01 LAB — TSH: TSH: 1.77 u[IU]/mL (ref 0.35–5.50)

## 2012-12-02 ENCOUNTER — Ambulatory Visit: Payer: BC Managed Care – PPO | Admitting: Adult Health

## 2012-12-04 ENCOUNTER — Encounter: Payer: Self-pay | Admitting: Pulmonary Disease

## 2012-12-04 NOTE — Progress Notes (Signed)
Subjective:    Patient ID: Monique Weber, female    DOB: April 06, 1956, 57 y.o.   MRN: [id number]  HPI 57 y/o WF here for a follow up visit... she has multiple medical problems as noted below...    ~  Sep 17, 2010:  70mo ROV & she had some CP & HBP in the interim, went to [facility], sent to Cards & seen by DrHochrein w/ adjustment in meds & incr in Lipitor to 80mg /d;  She was unable to walk suffic on treadmill for GXT (poor exercise tolerance) & is supposed to ret for Myoview;  She has since stopped the Lisinopril "it made me off balance" (this was suggested by a [place] physician friend) & she is now doing "90d mindful meditation- it was in [place]";  BP currently satis on Labetolol, Diltiazem, & Lasix;  Her Simva40 was changed to Lip80 & FLP doesn't look much better> she is encouraged to get on a good low chol/ low fat diet, work on weight reduction, increase exercise program, etc;  She still sees DrMcKinney/ MBaker for her nerves on Zoloft, Xanax, Restoril...  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 Molli Knock re: energy therapy, shakra points, meditation, etc;  She has had a good interval w/o new complaints or concerns> see prob list below:    Asthma> no problems over the last yr; she uses the Advair250Bid seasonally & reports that Autumn is the worst due to ragweed etc; also ha ProairHFA for prn use...    HBP> she notes her BP has been sl elev at home & measures 150/90 here today; on Labet200-2Bid, Diltiaz360/d, Lasix20/d; we discussed adding Losartan100/d as a trial and she will monitor BP at home; she knows to avoid caffeine etc w/ her hx of SVT.    Cardiac> seen by drHochrein 3/12 w/ AtypCP & HBP; she is upset about Myoview that cost her $2500 out of pocket & was neg    Chol> on Lip80 now (incr by DrHochrein) & FLP pending; last FLP 5/12 on ?Lip80? showed TChol 206, TG 109, HDL 80, LDL 113    Overweight> she is down 4#  this yr to 194# "I've been juicing"; we reviewed diet, exercise, & wt reduction program...    GI> she reports stable; she is off her Prilosec20 but can use it prn symptoms; her last colon was 2007 & neg- f/u 54yrs.    GYN> followed by DrNeal & up to date; she reports that all is ok...    Ortho> hx bilat shoulder pain, neck pain, foot pain; she reports all better after Chiropractor Rx & not requiring pain meds...    Anxiety> followed by DrMcKinney on Zoloft100, Xanax0.5-2Qhs, Ambien10 as needed;  Last CXR 11/10 showed borderline cardiomeg, clear lungs, irregularity at post part of left hemidiaph w/ sm defect & herniation of intra-abd fat (see 6/07 CTscan). EKG 3/12 showed NSR, rate75, axis within normal limits, intervals within normal limits, no acute ST-T wave changes... Nuclear Stress Test 3/12 was neg- no ischemia or infarction, EF=70%... LABS 4/13:  FLP- at goals;  Chems- wnl;  CBC- wnl;  TSH=1.57...   ~  November 30, 2012:  170mo ROV & Monique Weber's life continues to be stressful- work, home w/ husb disability, etc;  She had sinus infection, treated w/ Keflex and resolved;  She also had a bout of Iritis- saw Ophthalmology, DrMcCuen, ?detached vitreous, glaucoma w/ surg, & early  cats; she requests ACE to be done (normal at 17)...     Asthma> no problems over the last yr; she uses the Advair250Bid seasonally & reports that Autumn is the worst due to ragweed etc; also ha ProairHFA for prn use...    HBP> on Labet200-2Bid, Diltiaz360/d, Losartan100, Lasix20/d; BP= 140/80 & she denies CP, palpit, SOB, edema; she knows to avoid caffeine etc w/ her hx of SVT.    Cardiac> seen by DrHochrein 3/12 w/ AtypCP & HBP; she is upset about Myoview that cost her $2500 out of pocket & was neg    Chol> on Lip80 + diet; FLP shows TChol 210, TG 117, HDL 88, LDL 111    Overweight> weight is stable at 195# "I've been juicing"; we reviewed diet, exercise, & wt reduction program...    GI> Hx GERD & IBS> she reports stable; she is  off her Prilosec20 but can use it prn symptoms; her last colon was 2007 & neg- f/u 28yrs.    GYN> followed by DrNeal & up to date; on Premarin & Prometrium; she reports that all is ok...    Ortho> hx bilat shoulder pain, neck pain, foot pain; she reports all better after Chiropractor Rx & not requiring pain meds; she stretches and uses knee brace for walks...    Anxiety> followed by DrMcKinney on Zoloft100, Xanax0.5-2Qhs; she sees Sales executive... We reviewed prob list, meds, xrays and labs> see below for updates >>  CXR 7/14 showed normal heart size, clear lungs, DJD Tspine, NAD... LABS 7/14:  FLP- ok on Lip80;  Chems- wnl;  CBC- wnl;  TSH=1.77;  VitD=50;  ACE=18...           Problem List:  ASTHMA (ICD-493.90) - on ADVAIR 250 Bid & PROAIR HFA Prn... prev on Accolate but was only taking it 1-2 per week therefore discontinued... hx asthmatic bronchitis w/o exac x yrs although incr difficulty in the spring... currently doing well- denies cough, sputum, hemoptysis, worsening dyspnea,  wheezing, chest pains, snoring, daytime hypersomnolence, etc...  ~  CXR 11/10 showed borderline cardiomeg, clear lungs, sm left diaph defect... ~  CXR 7/14 showed normal heart size, clear lungs, DJD Tspine, NAD...  HYPERTENSION (ICD-401.9) - on ASA 81mg /d,  LABETOLOL 200mg - 2Bid, DILTIAZEM ER 360mg /d, & LASIX 20mg /d> takes meds regularly & tol well...  ~  baseline EKG shows NSR, WNL... ~  2DEcho 11/06 was normal- EF= 55-65% & no regional wall motion abn. ~  She notes INTOL to Lisinopril stating it caused her to fall, balance prob, dizzy & anxious; feels much better off this med. ~  3/12:  She had treadmill exercise test showing poor exercise tolerance & was non-diagnostic; then Myoview==> neg w/o ischemia & EF=70% ~  5/12: BP = 136/78 & similar at home... denies HA, fatigue, visual changes, CP, palipit, dizziness, syncope, dyspnea, edema, etc...  ~  3/13: BP= 150/90 & we decided to add LOSARTAN 100mg /d as a  trial... ~  7/14: on Labet200-2Bid, Diltiaz360/d, Losartan100, Lasix20/d; BP= 140/80 & she denies CP, palpit, SOB, edema; she knows to avoid caffeine etc w/ her hx of SVT.  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 no recent CP, palpit, etc...  HYPERCHOLESTEROLEMIA (ICD-272.0) - on SIMVASTATIN 40mg Qhs + FishOil...  ~  FLP 8/08 on Vytor10/40 showed TChol 264, TG 93, HDL 69, LDL 187... change to Simva80. ~  FLP 12/08 on Simva80 showed TChol 208, TG 78, HDL 61, LDL 139... continue med, better diet. ~  FLP 7/10 off med showed TChol 260, TG 85, HDL 96, LDL 156... rec> restart Simva80. ~  FLP 11/10 on Simva80 showed TChol 208, TG 89, HDL 80, LDL 130... she decr to Simva40 per [employer]. ~  FLP 5/11 on Simva40 showed TChol 227, TG 119, HDL 84, LDL 138 ~  FLP 11/11 on Simva40 showed TChol 233, TG 128, HDL 88, LDL 136... rec> ch to LIP40, later incr to LIP80 by Cards. ~  FLP 5/12 on Lip80 showed TChol 206, TG 109, HDL 80, LDL 113 ~  FLP 3/13 on Lip80 showed TChol 191, TG 109, HDL 80, LDL 89 ~  FLP 7/14 on Lip80 showed TChol 210, TG 117, HDL 88, LDL 111  OVERWEIGHT (ICD-278.02) - we reviewed diet + exercise program necessary to lose weight. ~  weight 8/08 = 185# ~  weight 7/10 = 201# ~  weight 11/10 = 185# ~  weight 5/11 = 187# ~  weight 11/11 = 191# ~  Weight 5/12 = 198# ~  Weight 3/13 = 194# ~  Weight 7/14 = 195#  Hx of GERD (ICD-530.81) - uses PRILOSEC OTC 20mg  Prn.  IRRITABLE BOWEL SYNDROME (ICD-564.1) - prev followed for GI by DrJohnson but she switched to [facility]... ~  colonoscopy 5/07 by DrJohnson was WNL.Marland Kitchen. she saw DrNat-Mann after that for post-colonoscopy pain & dx w/ IBS...  SHOULDER PAIN, BILATERAL (ICD-719.41) & STRESS FRACTURE, FOOT (ICD-733.94) - she's had right rotator cuff tear and eval by Ortho, then fall w/ injury to left shoulder... she sees chiropractor Prn w/ good relief of symptoms.... s/p bilat stress  fractures of the feet.  ANXIETY (ICD-300.00) & DEPRESSION (ICD-311) - on ALPRAZOLAM 0.5mg - takng 2Qhs, ZOLOFT 100mg /d, & AMBIEN 10mg Qhs prn... eval by DrMcKinney for Psychiatry & prev counselling as well...  Family Hx of MELANOMA (ICD-172.9) - both parents have hx malig melanoma removed- pt sees Derm Q14mo...  Health Maintenance:  GYN prev DrCollins, now DrNeal- on Premarin 0.625/ Prometrium (x12d every 3 mo)... also takes Calcium/ MVI/ Vit D 2000 u daily... she reports BMD in 2009 [facility]) w/ Osteopenia...   Past Surgical History  Procedure Laterality Date  . Dilation and curettage of uterus  1980    miscarriage 1980  . Cervical polypectomy  2005    Outpatient Encounter Prescriptions as of 11/30/2012  Medication Sig Dispense Refill  . ADVAIR DISKUS 250-50 MCG/DOSE AEPB INHALE 1 PUFF INTO THE LUNGS TWICE DAILY . RINSE MOUTH AFTER EACH USE  3 each  3  . albuterol (PROAIR HFA) 108 (90 BASE) MCG/ACT inhaler Inhale 2 puffs into the lungs every 6 (six) hours as needed for wheezing.  3 Inhaler  3  . ALPRAZolam (XANAX) 0.5 MG tablet Take 1 1/2 tablet by mouth at bedtime      . aspirin 81 MG tablet Take 81 mg by mouth daily.        Marland Kitchen atorvastatin (LIPITOR) 80 MG tablet TAKE 1 TABLET BY MOUTH EVERY DAY  90 tablet  3  . Calcium Carbonate-Vitamin D (CALCIUM 600+D) 600-400 MG-UNIT per tablet Take 1 tablet by mouth 2 (two) times daily.        . Cranberry Extract 200 MG CAPS Take 1 capsule by mouth daily.      Marland Kitchen diltiazem (TAZTIA XT) 360 MG 24 hr capsule Take 1 capsule (360 mg total) by mouth daily.  90 capsule  3  . estrogens, conjugated, (PREMARIN) 0.625 MG tablet Take 0.625 mg by mouth daily. Take daily for 21 days then do  not take for 7 days.       . furosemide (LASIX) 20 MG tablet TAKE ONE TABLET BY MOUTH ONCE DAILY  90 tablet  0  . labetalol (NORMODYNE) 200 MG tablet TAKE 2 TABLETS BY MOUTH TWICE DAILY  120 tablet  0  . losartan (COZAAR) 100 MG tablet TAKE 1 TABLET BY MOUTH DAILY  90 tablet  0  .  Multiple Vitamins-Minerals (MULTIVITAMIN WITH MINERALS) tablet Take 1 tablet by mouth daily.        . progesterone (PROMETRIUM) 100 MG capsule Take once every three months for 12 days       . sertraline (ZOLOFT) 100 MG tablet Take 100 mg by mouth daily.       . temazepam (RESTORIL) 30 MG capsule Take 30 mg by mouth at bedtime as needed.        . [DISCONTINUED] fish oil-omega-3 fatty acids 1000 MG capsule Take 2 g by mouth daily.        . [DISCONTINUED] Fluticasone-Salmeterol (ADVAIR DISKUS) 250-50 MCG/DOSE AEPB Inhale 1 puff into the lungs 2 (two) times daily. Rinse mouth after each use  180 each  3  . [DISCONTINUED] TAZTIA XT 360 MG 24 hr capsule TAKE 1 CAPSULE BY MOUTH DAILY  90 capsule  0   No facility-administered encounter medications on file as of 11/30/2012.    No Known Allergies   Current Medications, Allergies, Past Medical History, Past Surgical History, Family History, and Social History were reviewed in [employer] record.    Review of Systems       See HPI - all other systems neg except as noted... The patient denies anorexia, fever, weight loss, weight gain, vision loss, decreased hearing, hoarseness, chest pain, syncope, dyspnea on exertion, peripheral edema, prolonged cough, headaches, hemoptysis, abdominal pain, melena, hematochezia, severe indigestion/heartburn, hematuria, incontinence, muscle weakness, suspicious skin lesions, transient blindness, difficulty walking, depression, unusual weight change, abnormal bleeding, enlarged lymph nodes, and angioedema.     Objective:   Physical Exam      WD, Overweight, 57 y/o WF in NAD... Vital signs:  Reviewed... GENERAL:  Alert & oriented; pleasant & cooperative. HEENT:  [place]/AT, EOM-wnl, PERRLA, Fundi-benign, EACs-clear, TMs-wnl, NOSE-clear, THROAT-clear & wnl. NECK:  Supple w/ full ROM; no JVD; normal carotid impulses w/o bruits; no thyromegaly or nodules palpated; no lymphadenopathy. CHEST:  Clear to P  & A; without wheezes/ rales/ or rhonchi. HEART:  Regular Rhythm; without murmurs/ rubs/ or gallops. ABDOMEN:  Soft & nontender; normal bowel sounds; no organomegaly or masses detected. EXT: without deformities or arthritic changes; no varicose veins/ venous insuffic/ or edema. NEURO:  CN's intact; motor testing normal; sensory testing normal; gait normal & balance OK. DERM:  No lesions noted; no rash etc...  RADIOLOGY DATA:  Reviewed in the EPIC EMR & discussed w/ the patient...  LABORATORY DATA:  Reviewed in the EPIC EMR & discussed w/ the patient...   Assessment & Plan:   CPX>>  ASTHMA>  Stable w/o exac, we prefer Rx w/ Advair regularly but she prefers seasonally only,  & Proair rescue as needed...  HBP>  BP seems better controlled on her 4 meds- continue same...  CHOL>  Improved on the Lip80 but still not at goal> needs better diet & get wt down, same med for now...  Overwt>  Wt reduction is the key to improving the other parameters...  Anxiety>  She continues f/u w/ DrMcKinney & MBaker...   Patient's Medications  New Prescriptions   No  medications on file  Previous Medications   ADVAIR DISKUS 250-50 MCG/DOSE AEPB    INHALE 1 PUFF INTO THE LUNGS TWICE DAILY . RINSE MOUTH AFTER EACH USE   ALBUTEROL (PROAIR HFA) 108 (90 BASE) MCG/ACT INHALER    Inhale 2 puffs into the lungs every 6 (six) hours as needed for wheezing.   ALPRAZOLAM (XANAX) 0.5 MG TABLET    Take 1 1/2 tablet by mouth at bedtime   ASPIRIN 81 MG TABLET    Take 81 mg by mouth daily.     ATORVASTATIN (LIPITOR) 80 MG TABLET    TAKE 1 TABLET BY MOUTH EVERY DAY   CALCIUM CARBONATE-VITAMIN D (CALCIUM 600+D) 600-400 MG-UNIT PER TABLET    Take 1 tablet by mouth 2 (two) times daily.     CRANBERRY EXTRACT 200 MG CAPS    Take 1 capsule by mouth daily.   DILTIAZEM (TAZTIA XT) 360 MG 24 HR CAPSULE    Take 1 capsule (360 mg total) by mouth daily.   ESTROGENS, CONJUGATED, (PREMARIN) 0.625 MG TABLET    Take 0.625 mg by mouth  daily. Take daily for 21 days then do not take for 7 days.    FUROSEMIDE (LASIX) 20 MG TABLET    TAKE ONE TABLET BY MOUTH ONCE DAILY   LABETALOL (NORMODYNE) 200 MG TABLET    TAKE 2 TABLETS BY MOUTH TWICE DAILY   LOSARTAN (COZAAR) 100 MG TABLET    TAKE 1 TABLET BY MOUTH DAILY   MULTIPLE VITAMINS-MINERALS (MULTIVITAMIN WITH MINERALS) TABLET    Take 1 tablet by mouth daily.     PROGESTERONE (PROMETRIUM) 100 MG CAPSULE    Take once every three months for 12 days    SERTRALINE (ZOLOFT) 100 MG TABLET    Take 100 mg by mouth daily.    TEMAZEPAM (RESTORIL) 30 MG CAPSULE    Take 30 mg by mouth at bedtime as needed.    Modified Medications   No medications on file  Discontinued Medications   FISH OIL-OMEGA-3 FATTY ACIDS 1000 MG CAPSULE    Take 2 g by mouth daily.     FLUTICASONE-SALMETEROL (ADVAIR DISKUS) 250-50 MCG/DOSE AEPB    Inhale 1 puff into the lungs 2 (two) times daily. Rinse mouth after each use   TAZTIA XT 360 MG 24 HR CAPSULE    TAKE 1 CAPSULE BY MOUTH DAILY

## 2012-12-05 ENCOUNTER — Telehealth: Payer: Self-pay | Admitting: Pulmonary Disease

## 2012-12-05 NOTE — Telephone Encounter (Signed)
Notes Recorded by Michele Mcalpine, MD on 12/02/2012 at 2:29 PM Please notify patient>  FLP is ok on Lip80 & her HDL is very good at 88... Chems, LFTs, CBC, Thyroid, VitD> ALL WNL... Sarcoid blood test is normal- no active dis... Please notify patient>  CXR is essentially clear & WNL.Marland KitchenMarland Kitchen ---  I spoke with patient about results and she verbalized understanding and had no questions

## 2012-12-29 ENCOUNTER — Other Ambulatory Visit: Payer: Self-pay | Admitting: Pulmonary Disease

## 2013-01-12 ENCOUNTER — Other Ambulatory Visit: Payer: Self-pay | Admitting: Pulmonary Disease

## 2013-01-14 ENCOUNTER — Other Ambulatory Visit: Payer: Self-pay | Admitting: Pulmonary Disease

## 2013-02-19 ENCOUNTER — Other Ambulatory Visit: Payer: Self-pay | Admitting: Pulmonary Disease

## 2013-03-16 ENCOUNTER — Other Ambulatory Visit: Payer: Self-pay

## 2013-04-28 ENCOUNTER — Other Ambulatory Visit: Payer: Self-pay | Admitting: Pulmonary Disease

## 2013-05-19 ENCOUNTER — Other Ambulatory Visit: Payer: Self-pay | Admitting: *Deleted

## 2013-05-19 MED ORDER — LOSARTAN POTASSIUM 100 MG PO TABS: ORAL_TABLET | ORAL | Status: DC

## 2013-05-25 ENCOUNTER — Encounter: Payer: Self-pay | Admitting: Physician Assistant

## 2013-07-23 ENCOUNTER — Other Ambulatory Visit: Payer: Self-pay | Admitting: Pulmonary Disease

## 2013-08-01 ENCOUNTER — Telehealth: Payer: Self-pay | Admitting: Pulmonary Disease

## 2013-08-01 NOTE — Telephone Encounter (Signed)
ATC PT line rang several times and no answer and no VM WCB 

## 2013-08-02 MED ORDER — FLUTICASONE-SALMETEROL 250-50 MCG/DOSE IN AEPB: INHALATION_SPRAY | RESPIRATORY_TRACT | Status: DC

## 2013-08-02 NOTE — Telephone Encounter (Signed)
Per pt's chart, her Advair was denied on 3.15.15 with the comment "patient should contact provider first."  Pt returned call.  Spoke with patient and discussed with her SN's pending retirement from primary care as of August 09 2013.  Pt stated she is unsure of what office she would like to transfer to for primary care - she will call the office once she has decided on this; pt declined any office numbers.  Pt is aware SN can continue to follow and treat her asthma and will call for appt.  Rx for Asthma sent to verified pharmacy.

## 2013-08-02 NOTE — Telephone Encounter (Signed)
[id number] at home #. [place], [place]

## 2013-08-25 ENCOUNTER — Other Ambulatory Visit: Payer: Self-pay | Admitting: Pulmonary Disease

## 2013-08-26 ENCOUNTER — Other Ambulatory Visit: Payer: Self-pay | Admitting: Pulmonary Disease

## 2013-09-01 ENCOUNTER — Other Ambulatory Visit: Payer: Self-pay | Admitting: Pulmonary Disease

## 2013-09-30 ENCOUNTER — Other Ambulatory Visit: Payer: Self-pay | Admitting: Pulmonary Disease

## 2013-10-09 HISTORY — PX: ABDOMINAL HYSTERECTOMY: SHX81

## 2013-11-06 ENCOUNTER — Other Ambulatory Visit: Payer: Self-pay | Admitting: Pulmonary Disease

## 2013-11-19 ENCOUNTER — Other Ambulatory Visit: Payer: Self-pay | Admitting: Pulmonary Disease

## 2013-11-29 ENCOUNTER — Ambulatory Visit (INDEPENDENT_AMBULATORY_CARE_PROVIDER_SITE_OTHER): Payer: BC Managed Care – PPO

## 2013-11-29 ENCOUNTER — Ambulatory Visit (INDEPENDENT_AMBULATORY_CARE_PROVIDER_SITE_OTHER): Payer: BC Managed Care – PPO | Admitting: Physician Assistant

## 2013-11-29 VITALS — BP 140/80 | HR 57 | Temp 98.7°F | Ht 65.0 in | Wt 191.0 lb

## 2013-11-29 DIAGNOSIS — R233 Spontaneous ecchymoses: Secondary | ICD-10-CM

## 2013-11-29 DIAGNOSIS — M25539 Pain in unspecified wrist: Secondary | ICD-10-CM

## 2013-11-29 DIAGNOSIS — M25532 Pain in left wrist: Secondary | ICD-10-CM

## 2013-11-29 LAB — POCT CBC
Granulocyte percent: 77.4 %G (ref 37–80)
HCT, POC: 39.4 % (ref 37.7–47.9)
Hemoglobin: 13.2 g/dL (ref 12.2–16.2)
Lymph, poc: 1.3 (ref 0.6–3.4)
MCH, POC: 31.5 pg — AB (ref 27–31.2)
MCHC: 33.5 g/dL (ref 31.8–35.4)
MCV: 93.9 fL (ref 80–97)
MID (cbc): 0.6 (ref 0–0.9)
MPV: 7.2 fL (ref 0–99.8)
POC Granulocyte: 6.6 (ref 2–6.9)
POC LYMPH PERCENT: 15.2 %L (ref 10–50)
POC MID %: 7.4 %M (ref 0–12)
Platelet Count, POC: 378 10*3/uL (ref 142–424)
RBC: 4.2 M/uL (ref 4.04–5.48)
RDW, POC: 14.3 %
WBC: 8.5 10*3/uL (ref 4.6–10.2)

## 2013-11-29 NOTE — Progress Notes (Signed)
   Subjective:    Patient ID: Monique Weber, female    DOB: [birth year], 58 y.o.   MRN: [id number]  HPI 58 year old female presents for evaluation of a spontaneous hematoma of her left forearm. States symptoms started acutely this morning. Reports she had no significant trauma or injury. She is not on any blood thinning medications. Did recently have a hysterectomy and was taking a baby ASA but has now stopped that.  No hx of easy bruising/bleeding.  Has not noticed any other hematomas. Does admit to STS in her forearm that has improved somewhat. Also admits that her arm feels slightly warm.  No fever, chills, nausea, vomiting, dizziness, or headache.    Review of Systems  Constitutional: Negative for fever and chills.  Gastrointestinal: Negative for nausea and vomiting.  Skin: Positive for color change. Negative for wound.  Neurological: Negative for weakness and numbness.       Objective:   Physical Exam  Constitutional: She is oriented to person, place, and time. She appears well-developed and well-nourished.  HENT:  Head: Normocephalic and atraumatic.  Right Ear: External ear normal.  Left Ear: External ear normal.  Eyes: Conjunctivae are normal.  Neck: Normal range of motion.  Cardiovascular: Normal rate.   Pulses:      Radial pulses are 2+ on the right side, and 2+ on the left side.  Pulmonary/Chest: Effort normal.  Neurological: She is alert and oriented to person, place, and time. She has normal strength.  Sensation intact in hand/fingers  Skin:  Large hematoma covering entire ventral side of forearm. Slight STS. +TTP but no specific bony tenderness.   Psychiatric: She has a normal mood and affect. Her behavior is normal. Judgment and thought content normal.     Area of hematoma marked with skin scribe.     [facility] reading (PRIMARY) by  Dr. Joseph Art as no acute bony abnormality.   Assessment & Plan:  Spontaneous hematoma of forearm - Plan: POCT CBC, Upper extremity  Venous Duplex Left  Pain in joint, forearm, left - Plan: DG Forearm Left, Upper extremity Venous Duplex Left  Plan to obtain venous duplex tomorrow.  Continue ibuprofen as needed for discomfort. Ace wrap, ice, and elevation  Will recheck tomorrow after [place]. To ER if acutely worse through the night.

## 2013-11-30 ENCOUNTER — Ambulatory Visit (HOSPITAL_COMMUNITY)
Admission: RE | Admit: 2013-11-30 | Discharge: 2013-11-30 | Disposition: A | Discharge status: Home / Self Care | Payer: BC Managed Care – PPO | Source: Ambulatory Visit | Attending: Physician Assistant | Admitting: Physician Assistant

## 2013-11-30 ENCOUNTER — Ambulatory Visit (INDEPENDENT_AMBULATORY_CARE_PROVIDER_SITE_OTHER): Payer: BC Managed Care – PPO | Admitting: Physician Assistant

## 2013-11-30 VITALS — BP 138/76 | HR 76 | Temp 97.7°F | Resp 16 | Ht 65.5 in | Wt 191.6 lb

## 2013-11-30 DIAGNOSIS — R58 Hemorrhage, not elsewhere classified: Secondary | ICD-10-CM | POA: Insufficient documentation

## 2013-11-30 DIAGNOSIS — R233 Spontaneous ecchymoses: Secondary | ICD-10-CM

## 2013-11-30 DIAGNOSIS — M25532 Pain in left wrist: Secondary | ICD-10-CM

## 2013-11-30 DIAGNOSIS — M79609 Pain in unspecified limb: Secondary | ICD-10-CM

## 2013-11-30 DIAGNOSIS — E785 Hyperlipidemia, unspecified: Secondary | ICD-10-CM

## 2013-11-30 DIAGNOSIS — M25539 Pain in unspecified wrist: Secondary | ICD-10-CM | POA: Insufficient documentation

## 2013-11-30 DIAGNOSIS — I10 Essential (primary) hypertension: Secondary | ICD-10-CM

## 2013-11-30 DIAGNOSIS — M25519 Pain in unspecified shoulder: Secondary | ICD-10-CM | POA: Insufficient documentation

## 2013-11-30 DIAGNOSIS — E663 Overweight: Secondary | ICD-10-CM | POA: Insufficient documentation

## 2013-11-30 LAB — APTT: aPTT: 23 seconds — ABNORMAL LOW (ref 24–37)

## 2013-11-30 NOTE — Progress Notes (Signed)
Left upper extremity venous duplex:  No evidence of DVT or superficial thrombosis.    

## 2013-11-30 NOTE — Progress Notes (Signed)
   Subjective:    Patient ID: Monique Weber, female    DOB: 09/01/1955, 58 y.o.   MRN: [id number]  HPI 58 year old female presents for recheck of left forearm hematoma. She had a UE venous doppler today that was negative.  She is here for recheck. Reports it does not seem to be worsening, although is not significantly better. Pain has improved somewhat as well as her ROM in her wrist has increased. She has been using the Ace wrap and elevating her arm.  Also requesting lipid check and CMET. Plans to have CPE in the next 1-2 months, and wants to have labs already drawn. Tolerating her medications without adverse affects. Hx of hyperlipidemia and hypertension.     Review of Systems  Constitutional: Negative for chills.  Gastrointestinal: Negative for nausea and vomiting.  Skin: Positive for color change.       Objective:   Physical Exam  Constitutional: She is oriented to person, place, and time. She appears well-developed and well-nourished.  HENT:  Head: Normocephalic and atraumatic.  Right Ear: External ear normal.  Left Ear: External ear normal.  Eyes: Conjunctivae are normal.  Neck: Normal range of motion.  Cardiovascular: Normal rate.   Pulses:      Radial pulses are 2+ on the right side, and 2+ on the left side.  Capillary refill normal <2 seconds  Pulmonary/Chest: Effort normal.  Neurological: She is alert and oriented to person, place, and time. She has normal strength.  5/5 strength wrist flexion/extension, and radial/ulnar deviation  Skin:  Large hematoma on left ventral surface of forearm. Within borders of previously marked skin scribe. No warmth or erythema.   Psychiatric: She has a normal mood and affect. Her behavior is normal. Judgment and thought content normal.          Assessment & Plan:  Spontaneous hematoma of forearm - Plan: Protime-INR, APTT, CANCELED: APTT  Essential hypertension, benign - Plan: Comprehensive metabolic panel  Other and  unspecified hyperlipidemia - Plan: Comprehensive metabolic panel, Lipid panel  Hematoma - stable/improving. Discussed that this will likely take weeks to completely resolve.  Continue Ace wraps, ice, and elevation. Motrin as needed for pain RTC precautions discussed. Consider rehab if ROM continues to be limited.  Plan to schedule CPE with Dr. Lorelei Pont at 104 in the next 1-2 months.

## 2013-12-01 LAB — LIPID PANEL
Cholesterol: 214 mg/dL — ABNORMAL HIGH (ref 0–200)
HDL: 86 mg/dL (ref >39)
LDL Cholesterol: 92 mg/dL (ref 0–99)
Total CHOL/HDL Ratio: 2.5 Ratio
Triglycerides: 178 mg/dL — ABNORMAL HIGH (ref <150)
VLDL: 36 mg/dL (ref 0–40)

## 2013-12-01 LAB — COMPREHENSIVE METABOLIC PANEL
ALT: 30 U/L (ref 0–35)
AST: 24 U/L (ref 0–37)
Albumin: 4.2 g/dL (ref 3.5–5.2)
Alkaline Phosphatase: 80 U/L (ref 39–117)
BUN: 14 mg/dL (ref 6–23)
CO2: 28 mEq/L (ref 19–32)
Calcium: 9.5 mg/dL (ref 8.4–10.5)
Chloride: 101 mEq/L (ref 96–112)
Creat: 0.72 mg/dL (ref 0.50–1.10)
Glucose, Bld: 81 mg/dL (ref 70–99)
Potassium: 4.1 mEq/L (ref 3.5–5.3)
Sodium: 138 mEq/L (ref 135–145)
Total Bilirubin: 0.3 mg/dL (ref 0.2–1.2)
Total Protein: 6.9 g/dL (ref 6.0–8.3)

## 2013-12-01 LAB — PROTIME-INR
INR: 0.89 (ref <1.50)
Prothrombin Time: 12 seconds (ref 11.6–15.2)

## 2013-12-06 ENCOUNTER — Other Ambulatory Visit: Payer: Self-pay | Admitting: Pulmonary Disease

## 2013-12-13 ENCOUNTER — Other Ambulatory Visit: Payer: Self-pay | Admitting: Pulmonary Disease

## 2013-12-14 ENCOUNTER — Telehealth: Payer: Self-pay | Admitting: Pulmonary Disease

## 2013-12-14 MED ORDER — DILTIAZEM HCL ER BEADS 360 MG PO CP24: ORAL_CAPSULE | ORAL | Status: DC

## 2013-12-14 NOTE — Telephone Encounter (Signed)
Called and spoke to pt. Pt stated she needed a refill of the Tiztia to verified pharmacy. Pt last seen in 11/2012. Pt requested appt and is aware of the increase in co-pay d/t pt still being a primary care pt. Appt made and rx sent. Pt aware. Nothing further needed.

## 2013-12-21 ENCOUNTER — Encounter: Payer: Self-pay | Admitting: Pulmonary Disease

## 2013-12-21 ENCOUNTER — Ambulatory Visit (INDEPENDENT_AMBULATORY_CARE_PROVIDER_SITE_OTHER): Payer: BC Managed Care – PPO | Admitting: Pulmonary Disease

## 2013-12-21 VITALS — BP 130/80 | HR 71 | Temp 97.6°F | Ht 65.0 in | Wt 192.4 lb

## 2013-12-21 DIAGNOSIS — K589 Irritable bowel syndrome without diarrhea: Secondary | ICD-10-CM

## 2013-12-21 DIAGNOSIS — M899 Disorder of bone, unspecified: Secondary | ICD-10-CM

## 2013-12-21 DIAGNOSIS — K219 Gastro-esophageal reflux disease without esophagitis: Secondary | ICD-10-CM

## 2013-12-21 DIAGNOSIS — I498 Other specified cardiac arrhythmias: Secondary | ICD-10-CM

## 2013-12-21 DIAGNOSIS — E663 Overweight: Secondary | ICD-10-CM

## 2013-12-21 DIAGNOSIS — Z0000 Encounter for general adult medical examination without abnormal findings: Secondary | ICD-10-CM

## 2013-12-21 DIAGNOSIS — M949 Disorder of cartilage, unspecified: Secondary | ICD-10-CM

## 2013-12-21 DIAGNOSIS — F329 Major depressive disorder, single episode, unspecified: Secondary | ICD-10-CM

## 2013-12-21 DIAGNOSIS — E7800 Pure hypercholesterolemia, unspecified: Secondary | ICD-10-CM

## 2013-12-21 DIAGNOSIS — F3289 Other specified depressive episodes: Secondary | ICD-10-CM

## 2013-12-21 DIAGNOSIS — J45909 Unspecified asthma, uncomplicated: Secondary | ICD-10-CM

## 2013-12-21 DIAGNOSIS — Z23 Encounter for immunization: Secondary | ICD-10-CM

## 2013-12-21 DIAGNOSIS — I10 Essential (primary) hypertension: Secondary | ICD-10-CM

## 2013-12-21 MED ORDER — ATORVASTATIN CALCIUM 80 MG PO TABS: ORAL_TABLET | ORAL | Status: DC

## 2013-12-21 MED ORDER — DILTIAZEM HCL ER BEADS 360 MG PO CP24: ORAL_CAPSULE | ORAL | Status: DC

## 2013-12-21 MED ORDER — ALBUTEROL SULFATE HFA 108 (90 BASE) MCG/ACT IN AERS: 2.0000 | INHALATION_SPRAY | Freq: Four times a day (QID) | RESPIRATORY_TRACT | Status: DC | PRN

## 2013-12-21 MED ORDER — LOSARTAN POTASSIUM 100 MG PO TABS: ORAL_TABLET | ORAL | Status: DC

## 2013-12-21 MED ORDER — FUROSEMIDE 20 MG PO TABS: ORAL_TABLET | ORAL | Status: DC

## 2013-12-21 MED ORDER — LABETALOL HCL 200 MG PO TABS: ORAL_TABLET | ORAL | Status: DC

## 2013-12-21 MED ORDER — FLUTICASONE-SALMETEROL 250-50 MCG/DOSE IN AEPB: INHALATION_SPRAY | RESPIRATORY_TRACT | Status: DC

## 2013-12-21 NOTE — Progress Notes (Signed)
Subjective:    Patient ID: Monique Weber, female    DOB: 1955-06-28, 58 y.o.   MRN: [id number]  HPI 58 y/o WF here for a follow up visit... she has multiple medical problems as noted below...   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 Margretta Sidle re: energy therapy, shakra points, meditation, etc;  She has had a good interval w/o new complaints or concerns> see prob list below:    Asthma> no problems over the last yr; she uses the Advair250Bid seasonally & reports that Autumn is the worst due to ragweed etc; also ha ProairHFA for prn use...    HBP> she notes her BP has been sl elev at home & measures 150/90 here today; on Labet200-2Bid, Diltiaz360/d, Lasix20/d; we discussed adding Losartan100/d as a trial and she will monitor BP at home; she knows to avoid caffeine etc w/ her hx of SVT.    Cardiac> seen by drHochrein 3/12 w/ AtypCP & HBP; she is upset about Myoview that cost her $2500 out of pocket & was neg    Chol> on Lip80 now (incr by DrHochrein) & FLP pending; last FLP 5/12 on ?Lip80? showed TChol 206, TG 109, HDL 80, LDL 113    Overweight> she is down 4# this yr to 194# "I've been juicing"; we reviewed diet, exercise, & wt reduction program...    GI> she reports stable; she is off her Prilosec20 but can use it prn symptoms; her last colon was 2007 & neg- f/u 80yrs.    GYN> followed by DrNeal & up to date; she reports that all is ok...    Ortho> hx bilat shoulder pain, neck pain, foot pain; she reports all better after Chiropractor Rx & not requiring pain meds...    Anxiety> followed by DrMcKinney on Zoloft100, Xanax0.5-2Qhs, Ambien10 as needed;   Last CXR 11/10 showed borderline cardiomeg, clear lungs, irregularity at post part of left hemidiaph w/ sm defect & herniation of intra-abd fat (see 6/07 CTscan).  EKG 3/12 showed NSR, rate75, axis within normal limits,  intervals within normal limits, no acute ST-T wave changes...  Nuclear Stress Test 3/12 was neg- no ischemia or infarction, EF=70%...  LABS 4/13:  FLP- at goals;  Chems- wnl;  CBC- wnl;  TSH=1.57...   ~  November 30, 2012:  66mo ROV & Monique Weber's life continues to be stressful- work, home w/ husb disability, etc;  She had sinus infection, treated w/ Keflex and resolved;  She also had a bout of Iritis- saw Ophthalmology, DrMcCuen, ?detached vitreous, glaucoma w/ surg, & early cats; she requests ACE to be done (normal at 30)...     Asthma> no problems over the last yr; she uses the Advair250Bid seasonally & reports that Autumn is the worst due to ragweed etc; also ha ProairHFA for prn use...    HBP> on Labet200-2Bid, Diltiaz360/d, Losartan100, Lasix20/d; BP= 140/80 & she denies CP, palpit, SOB, edema; she knows to avoid caffeine etc w/ her hx of SVT.    Cardiac> seen by DrHochrein 3/12 w/ AtypCP & HBP; she is upset about Myoview that cost her $2500 out of pocket & was neg    Chol> on Lip80 + diet; FLP shows TChol 210, TG 117, HDL 88, LDL 111    Overweight> weight is stable at 195# "I've been juicing"; we reviewed diet, exercise, &  wt reduction program...    GI> Hx GERD & IBS> she reports stable; she is off her Prilosec20 but can use it prn symptoms; her last colon was 2007 & neg- f/u 32yrs.    GYN> followed by DrNeal & up to date; on Premarin & Prometrium; she reports that all is ok...    Ortho> hx bilat shoulder pain, neck pain, foot pain; she reports all better after Chiropractor Rx & not requiring pain meds; she stretches and uses knee brace for walks...    Anxiety> followed by DrMcKinney on Zoloft100, Xanax0.5-2Qhs; she sees Runner, broadcasting/film/video... We reviewed prob list, meds, xrays and labs> see below for updates >>   CXR 7/14 showed normal heart size, clear lungs, DJD Tspine, NAD...  LABS 7/14:  FLP- ok on Lip80;  Chems- wnl;  CBC- wnl;  TSH=1.77;  VitD=50;  ACE=18...  ~  December 21, 2013:  Yearly ROV &  CPX> Monique Weber reports a good yr overall>  She had a total Vaginal Hysterectomy 6/15 from DrNeal (at the [facility]- no records available) and now fully recovered & feeling better...  Recently she developed a hematoma on her left arm which she believes came from carrying groceries in her left hand; she was seen [facility] x2, they did Labs (all wnl), XRay (neg), and VenDopplers (neg for blood clot); it has since resolved, no resid bruising etc...  She has also had f/u w/ Ophthalmology, DrMcCuen w/ hx glaucoma (s/p surg) and cataracts (L>R, pt states needs surg soon)... We reviewed the following medical problems during today's office visit >>     Asthma> no problems over the last yr; she uses the Advair250Bid seasonally & reports that Autumn is the worst due to ragweed etc; also ha ProairHFA for prn use...    HBP> on Labet200-2Bid, Diltiaz360/d, Losartan100, Lasix20/d; BP= 130/80 & she denies CP, palpit, SOB, edema; she knows to avoid caffeine etc w/ her hx of SVT.    Cardiac> seen by DrHochrein 2012 w/ AtypCP & HBP; she is upset about Myoview that cost her $2500 out of pocket & was neg    Chol> on Lip80 + diet; FLP 7/15 showed TChol 214, TG 178, HDL 86, LDL 92... Reviewed low fat diet 7 discussed wt reduction strategies.    Overweight> weight is stable at 192# "I've been juicing"; we reviewed diet, exercise, & wt reduction program...    GI> Hx GERD & IBS> she reports stable; she is off her Prilosec20 but can use it prn symptoms; her last colon was 2007 & neg- f/u 45yrs.    GYN> followed by Elmon Else on Premarin; she had TVH 6/15 at [place] (we do not have records) and she reports doing satis; Gyn does her mammography & BMD.    Ortho> hx bilat shoulder pain, neck pain, knee pain, foot pain; she reports prev Chiropractor Rx & uses OTCAdvil prn; sees DrACollins for Ortho w/ knee shots that helped; she stretches and uses knee brace prn.     Anxiety> followed by DrMcKinney (recentl retired) on [place] prn; she also sees Runner, broadcasting/film/video for counseling... We reviewed prob list, meds, xrays and labs> see below for updates >> We gave her the PREVNAR-13 vaccine today, reminded to get the 2015 Flu vaccine in the fall...  LABS 7/15 in EPIC>  FLP- not at goals w/ TG=178;  Chems- wnl;  CBC- wnl...            Problem List:  ASTHMA (ICD-493.90) - on  ADVAIR 250 Bid & PROAIR HFA Prn...  ~  prev on Accolate but was only taking it 1-2 per week therefore discontinued... hx asthmatic bronchitis w/o exac x yrs although incr difficulty in the spring... ~  CXR 11/10 showed borderline cardiomeg, clear lungs, sm left diaph defect... ~  CXR 7/14 showed normal heart size, clear lungs, DJD Tspine, NAD... ~  currently doing well- denies cough, sputum, hemoptysis, worsening dyspnea,  wheezing, chest pains, snoring, daytime hypersomnolence, etc...    HYPERTENSION (ICD-401.9) >>  ~  baseline EKG shows NSR, WNL... ~  2DEcho 11/06 was normal- EF= 55-65% & no regional wall motion abn. ~  She notes INTOL to Lisinopril stating it caused her to fall, balance prob, dizzy & anxious; feels much better off this med. ~  3/12:  She had treadmill exercise test showing poor exercise tolerance & was non-diagnostic; then Myoview==> neg w/o ischemia & EF=70% ~  5/12: BP = 136/78 & similar at home... denies HA, fatigue, visual changes, CP, palipit, dizziness, syncope, dyspnea, edema, etc...  ~  3/13: BP= 150/90 & we decided to add LOSARTAN 100mg /d as a trial... ~  7/14: on Labet200-2Bid, Diltiaz360/d, Losartan100, Lasix20/d; BP= 140/80 & she denies CP, palpit, SOB, edema; she knows to avoid caffeine etc w/ her hx of SVT. ~  8/15: on Labet200-2Bid, Diltiaz360/d, Losartan100, Lasix20/d; BP= 130/80 & she remains asymptomatic...  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 no recent CP, palpit, etc...  HYPERCHOLESTEROLEMIA (ICD-272.0) - on SIMVASTATIN  40mg Qhs + FishOil...  ~  [place] 8/08 on Vytor10/40 showed TChol 264, TG 93, HDL 69, LDL 187... change to Simva80. ~  FLP 12/08 on Simva80 showed TChol 208, TG 78, HDL 61, LDL 139... continue med, better diet. ~  FLP 7/10 off med showed TChol 260, TG 85, HDL 96, LDL 156... rec> restart Simva80. ~  [place] 11/10 on Simva80 showed TChol 208, TG 89, HDL 80, LDL 130... she decr to Simva40 per [employer]. ~  FLP 5/11 on Simva40 showed TChol 227, TG 119, HDL 84, LDL 138 ~  FLP 11/11 on Simva40 showed TChol 233, TG 128, HDL 88, LDL 136... rec> ch to LIP40, later incr to LIP80 by Cards. ~  FLP 5/12 on Lip80 showed TChol 206, TG 109, HDL 80, LDL 113 ~  FLP 3/13 on Lip80 showed TChol 191, TG 109, HDL 80, LDL 89 ~  FLP 7/14 on Lip80 showed TChol 210, TG 117, HDL 88, LDL 111 ~  FLP 7/15 on Lip80 showed TChol 214, TG 178, HDL 86, LDL 92... Reminded to follow low fat diet & get wt down.  OVERWEIGHT (ICD-278.02) - we reviewed diet + exercise program necessary to lose weight. ~  weight 8/08 = 185# ~  weight 7/10 = 201# ~  weight 11/10 = 185# ~  weight 5/11 = 187# ~  weight 11/11 = 191# ~  Weight 5/12 = 198# ~  Weight 3/13 = 194# ~  Weight 7/14 = 195# ~  Weight 7/15 = 192#  Hx of GERD (ICD-530.81) - uses PRILOSEC OTC 20mg  Prn.  IRRITABLE BOWEL SYNDROME (ICD-564.1) - prev followed for GI by DrJohnson but she switched to [facility]... ~  colonoscopy 5/07 by DrJohnson was WNL.Marland Kitchen. she saw DrNat-Mann after that for post-colonoscopy pain & dx w/ IBS...  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  ~  she sees chiropractor Prn w/ good relief of symptoms....  ~  s/p bilat stress fractures of the feet. ~  8/15: she reports eval by DrACollins, [facility] (states ACL and cartilage tears) w/ shots in left knee which helped...  ANXIETY (ICD-300.00) & DEPRESSION (ICD-311)  - on ALPRAZOLAM 0.5mg - takng 2Qhs, ZOLOFT 100mg /d, & RESTORIL 30mg Qhs prn... eval by DrMcKinney for Psychiatry & prev counselling as well...  Family Hx of MELANOMA (ICD-172.9) - both parents have hx malig melanoma removed- pt sees Derm Q48mo...  Health Maintenance:  GYN prev DrCollins, now DrNeal- on Premarin 0.625/ Prometrium (x12d every 3 mo)... also takes Calcium/ MVI/ Vit D 2000 u daily... she reports BMD in 2009 [facility]) w/ Osteopenia... ~  GI:  Followed by [facility] w/ repeat colonoscopy due 2017... ~  GYN:  Followed by Elmon Else w/ total vag hyst done 6/15; she is up to date on mammograms and they do her BMD as well (we do not have records from them)... ~  Immuniz:  She gets the yearly Flu vaccines;  She had TDAP 2013;  She was given PREVNAR-13 8/15;  We discussed shingles vaccine...   Past Surgical History  Procedure Laterality Date  . Dilation and curettage of uterus  1980    miscarriage 1980  . Cervical polypectomy  2005  . Abdominal hysterectomy      June 2015    Outpatient Encounter Prescriptions as of 12/21/2013  Medication Sig  . albuterol (PROAIR HFA) 108 (90 BASE) MCG/ACT inhaler Inhale 2 puffs into the lungs every 6 (six) hours as needed for wheezing.  Marland Kitchen ALPRAZolam (XANAX) 0.5 MG tablet Take 1 1/2 tablet by mouth at bedtime  . atorvastatin (LIPITOR) 80 MG tablet TAKE 1 TABLET BY MOUTH EVERY DAY  . Cranberry Extract 200 MG CAPS Take 1 capsule by mouth daily.  Marland Kitchen diltiazem (TAZTIA XT) 360 MG 24 hr capsule TAKE 1 CAPSULE BY MOUTH EVERY DAY  . estrogens, conjugated, (PREMARIN) 0.625 MG tablet Take 0.625 mg by mouth daily. Take daily for 21 days then do not take for 7 days.   . Fluticasone-Salmeterol (ADVAIR DISKUS) 250-50 MCG/DOSE AEPB INHALE 1 PUFF BY MOUTH TWICE DAILY. RINSE MOUTH AFTER USE  . furosemide (LASIX) 20 MG tablet TAKE 1 TABLET BY MOUTH EVERY DAY  . ibuprofen (ADVIL,MOTRIN) 200 MG tablet Take 200 mg by mouth 2 (two) times daily as needed.  . labetalol (NORMODYNE)  200 MG tablet TAKE 2 TABLETS BY MOUTH TWICE DAILY  . losartan (COZAAR) 100 MG tablet TAKE 1 TABLET BY MOUTH EVERY DAY  . Multiple Vitamins-Minerals (MULTIVITAMIN WITH MINERALS) tablet Take 1 tablet by mouth daily.    . sertraline (ZOLOFT) 100 MG tablet Take 100 mg by mouth daily.   . temazepam (RESTORIL) 30 MG capsule Take 30 mg by mouth at bedtime as needed.    . [DISCONTINUED] ADVAIR DISKUS 250-50 MCG/DOSE AEPB INHALE 1 PUFF BY MOUTH TWICE DAILY. RINSE MOUTH AFTER USE  . [DISCONTINUED] albuterol (PROAIR HFA) 108 (90 BASE) MCG/ACT inhaler Inhale 2 puffs into the lungs every 6 (six) hours as needed for wheezing.  . [DISCONTINUED] atorvastatin (LIPITOR) 80 MG tablet TAKE 1 TABLET BY MOUTH EVERY DAY  . [DISCONTINUED] diltiazem (TAZTIA XT) 360 MG 24 hr capsule TAKE 1 CAPSULE BY MOUTH EVERY DAY  . [DISCONTINUED] furosemide (LASIX) 20 MG tablet TAKE 1 TABLET BY MOUTH EVERY DAY  . [DISCONTINUED] labetalol (NORMODYNE) 200 MG tablet TAKE 2 TABLETS BY MOUTH TWICE  DAILY  . [DISCONTINUED] losartan (COZAAR) 100 MG tablet TAKE 1 TABLET BY MOUTH EVERY DAY  . [DISCONTINUED] PROAIR HFA 108 (90 BASE) MCG/ACT inhaler USE 2 SPRAYS BY MOUTH EVERY 6 HOURS AS NEEDED FOR WHEEZING    No Known Allergies   Current Medications, Allergies, Past Medical History, Past Surgical History, Family History, and Social History were reviewed in [employer] record.    Review of Systems       See HPI - all other systems neg except as noted... The patient denies anorexia, fever, weight loss, weight gain, vision loss, decreased hearing, hoarseness, chest pain, syncope, dyspnea on exertion, peripheral edema, prolonged cough, headaches, hemoptysis, abdominal pain, melena, hematochezia, severe indigestion/heartburn, hematuria, incontinence, muscle weakness, suspicious skin lesions, transient blindness, difficulty walking, depression, unusual weight change, abnormal bleeding, enlarged lymph nodes, and angioedema.      Objective:   Physical Exam      WD, Overweight, 58 y/o WF in NAD... Vital signs:  Reviewed... GENERAL:  Alert & oriented; pleasant & cooperative. HEENT:  [place]/AT, EOM-wnl, PERRLA, Fundi-benign, EACs-clear, TMs-wnl, NOSE-clear, THROAT-clear & wnl. NECK:  Supple w/ full ROM; no JVD; normal carotid impulses w/o bruits; no thyromegaly or nodules palpated; no lymphadenopathy. CHEST:  Clear to P & A; without wheezes/ rales/ or rhonchi. HEART:  Regular Rhythm; without murmurs/ rubs/ or gallops. ABDOMEN:  Soft & nontender; normal bowel sounds; no organomegaly or masses detected. EXT: without deformities or arthritic changes; no varicose veins/ venous insuffic/ or edema. NEURO:  CN's intact; motor testing normal; sensory testing normal; gait normal & balance OK. DERM:  No lesions noted; no rash etc...  RADIOLOGY DATA:  Reviewed in the EPIC EMR & discussed w/ the patient...  LABORATORY DATA:  Reviewed in the EPIC EMR & discussed w/ the patient...   Assessment & Plan:   CPX>>  ASTHMA>  Stable w/o exac, we prefer Rx w/ Advair regularly but she prefers seasonally only,  & Proair rescue as needed...  HBP>  BP seems better controlled on her 4 meds- continue same...  CHOL>  Improved on the Lip80 but still not at goal> needs better low fat diet & get wt down, same med for now...  Overwt>  Wt reduction is the key to improving the other parameters...  Anxiety, Depression>  She continues f/u w/ DrMcKinney & MBaker...   Patient's Medications  New Prescriptions   No medications on file  Previous Medications   ALPRAZOLAM (XANAX) 0.5 MG TABLET    Take 1 1/2 tablet by mouth at bedtime   CRANBERRY EXTRACT 200 MG CAPS    Take 1 capsule by mouth daily.   ESTROGENS, CONJUGATED, (PREMARIN) 0.625 MG TABLET    Take 0.625 mg by mouth daily. Take daily for 21 days then do not take for 7 days.    IBUPROFEN (ADVIL,MOTRIN) 200 MG TABLET    Take 200 mg by mouth 2 (two) times daily as needed.   MULTIPLE  VITAMINS-MINERALS (MULTIVITAMIN WITH MINERALS) TABLET    Take 1 tablet by mouth daily.     SERTRALINE (ZOLOFT) 100 MG TABLET    Take 100 mg by mouth daily.    TEMAZEPAM (RESTORIL) 30 MG CAPSULE    Take 30 mg by mouth at bedtime as needed.    Modified Medications   Modified Medication Previous Medication   ALBUTEROL (PROAIR HFA) 108 (90 BASE) MCG/ACT INHALER albuterol (PROAIR HFA) 108 (90 BASE) MCG/ACT inhaler      Inhale 2 puffs into the lungs every 6 (  six) hours as needed for wheezing.    Inhale 2 puffs into the lungs every 6 (six) hours as needed for wheezing.   ATORVASTATIN (LIPITOR) 80 MG TABLET atorvastatin (LIPITOR) 80 MG tablet      TAKE 1 TABLET BY MOUTH EVERY DAY    TAKE 1 TABLET BY MOUTH EVERY DAY   DILTIAZEM (TAZTIA XT) 360 MG 24 HR CAPSULE diltiazem (TAZTIA XT) 360 MG 24 hr capsule      TAKE 1 CAPSULE BY MOUTH EVERY DAY    TAKE 1 CAPSULE BY MOUTH EVERY DAY   FLUTICASONE-SALMETEROL (ADVAIR DISKUS) 250-50 MCG/DOSE AEPB ADVAIR DISKUS 250-50 MCG/DOSE AEPB      INHALE 1 PUFF BY MOUTH TWICE DAILY. RINSE MOUTH AFTER USE    INHALE 1 PUFF BY MOUTH TWICE DAILY. RINSE MOUTH AFTER USE   FUROSEMIDE (LASIX) 20 MG TABLET furosemide (LASIX) 20 MG tablet      TAKE 1 TABLET BY MOUTH EVERY DAY    TAKE 1 TABLET BY MOUTH EVERY DAY   LABETALOL (NORMODYNE) 200 MG TABLET labetalol (NORMODYNE) 200 MG tablet      TAKE 2 TABLETS BY MOUTH TWICE DAILY    TAKE 2 TABLETS BY MOUTH TWICE DAILY   LOSARTAN (COZAAR) 100 MG TABLET losartan (COZAAR) 100 MG tablet      TAKE 1 TABLET BY MOUTH EVERY DAY    TAKE 1 TABLET BY MOUTH EVERY DAY  Discontinued Medications   PROAIR HFA 108 (90 BASE) MCG/ACT INHALER    USE 2 SPRAYS BY MOUTH EVERY 6 HOURS AS NEEDED FOR WHEEZING

## 2013-12-21 NOTE — Patient Instructions (Signed)
Today we updated your med list in our EPIC system...    Continue your current medications the same...    We refilled your meds per request...  Keep up the good work w/ diet & exercise...  Call for any questions...  Let's plan a follow up visit in 13yr, sooner if needed for problems.Marland KitchenMarland Kitchen

## 2014-01-03 ENCOUNTER — Other Ambulatory Visit: Payer: Self-pay | Admitting: Pulmonary Disease

## 2014-01-07 ENCOUNTER — Other Ambulatory Visit: Payer: Self-pay | Admitting: Pulmonary Disease

## 2014-01-20 ENCOUNTER — Other Ambulatory Visit: Payer: Self-pay | Admitting: Pulmonary Disease

## 2014-03-15 ENCOUNTER — Other Ambulatory Visit: Payer: Self-pay | Admitting: Pulmonary Disease

## 2014-05-31 ENCOUNTER — Ambulatory Visit (INDEPENDENT_AMBULATORY_CARE_PROVIDER_SITE_OTHER): Payer: BC Managed Care – PPO | Admitting: Family Medicine

## 2014-05-31 VITALS — BP 120/70 | HR 83 | Temp 99.1°F | Resp 16 | Ht 67.0 in | Wt 192.6 lb

## 2014-05-31 DIAGNOSIS — J0120 Acute ethmoidal sinusitis, unspecified: Secondary | ICD-10-CM

## 2014-05-31 DIAGNOSIS — R6889 Other general symptoms and signs: Secondary | ICD-10-CM

## 2014-05-31 LAB — POCT INFLUENZA A/B
Influenza A, POC: NEGATIVE
Influenza B, POC: NEGATIVE

## 2014-05-31 MED ORDER — OSELTAMIVIR PHOSPHATE 75 MG PO CAPS: 75.0000 mg | ORAL_CAPSULE | Freq: Two times a day (BID) | ORAL | Status: DC

## 2014-05-31 NOTE — Progress Notes (Signed)
Chief Complaint:  Chief Complaint  Patient presents with  . Generalized Body Aches    started last night;   . Fever    up to 101.7  . Chills  . Headache  . Joint Pain  . Nausea    HPI: Monique Weber is a 59 y.o. female who is here for 1 day history of feeling like the flu has hit her, everything hurts, even her hair hurts, she had temp of 101.7, she took ibuprofen 1 hr ago, she has had body aches, joint aches, and also headache and nausea, chills. She has some sinus sxs but nothing major. No SOB or CP, does not cough anything up. She denies ear pain. Denies rashes or diarrhea or abd pain, just wants to go to bed. She ahs a hx of asthma and has been on her inhalers without fail. She usually gets the flu vaccine, did not get it this year.   SpO2 Readings from Last 3 Encounters:  05/31/14 92%  12/21/13 97%  11/30/13 96%     Past Medical History  Diagnosis Date  . Unspecified asthma(493.90)   . Unspecified essential hypertension   . Other specified cardiac dysrhythmias(427.89)   . Pure hypercholesterolemia   . Overweight(278.02)   . Esophageal reflux   . Irritable bowel syndrome   . Pain in joint, shoulder region   . Stress fracture of the metatarsals   . Disorder of bone and cartilage, unspecified   . Anxiety state, unspecified   . Depressive disorder, not elsewhere classified   . Melanoma of skin, site unspecified    Past Surgical History  Procedure Laterality Date  . Dilation and curettage of uterus  1980    miscarriage 1980  . Cervical polypectomy  2005  . Abdominal hysterectomy      June 2015   History   Social History  . Marital Status: Married    Spouse Name: N/A    Number of Children: 3  . Years of Education: N/A   Occupational History  . student     getting masters in liberal arts   Social History Main Topics  . Smoking status: Former Research scientist (life sciences)  . Smokeless tobacco: None     Comment: off and on since 1991  . Alcohol Use: Yes     Comment:  social use  . Drug Use: None  . Sexual Activity: None   Other Topics Concern  . None   Social History Narrative   Family History  Problem Relation Age of Onset  . Heart disease Father   . Melanoma Father   . Hypertension Mother   . Melanoma Mother   . Arthritis Mother     DJD  . Other Mother     Hashimoto's Encephalopathy   No Known Allergies Prior to Admission medications   Medication Sig Start Date End Date Taking? Authorizing Provider  albuterol (PROAIR HFA) 108 (90 BASE) MCG/ACT inhaler Inhale 2 puffs into the lungs every 6 (six) hours as needed for wheezing. 12/21/13  Yes Noralee Space, MD  ALPRAZolam Duanne Moron) 0.5 MG tablet Take 1 1/2 tablet by mouth at bedtime   Yes Historical Provider, MD  atorvastatin (LIPITOR) 80 MG tablet TAKE 1 TABLET BY MOUTH EVERY DAY 12/21/13  Yes Noralee Space, MD  Cranberry Extract 200 MG CAPS Take 1 capsule by mouth daily.   Yes Historical Provider, MD  diltiazem (TAZTIA XT) 360 MG 24 hr capsule TAKE 1 CAPSULE BY MOUTH EVERY DAY  12/21/13  Yes Noralee Space, MD  estrogens, conjugated, (PREMARIN) 0.625 MG tablet Take 0.625 mg by mouth daily. Take daily for 21 days then do not take for 7 days.    Yes Historical Provider, MD  Fluticasone-Salmeterol (ADVAIR DISKUS) 250-50 MCG/DOSE AEPB INHALE 1 PUFF BY MOUTH TWICE DAILY. RINSE MOUTH AFTER USE 12/21/13  Yes Noralee Space, MD  furosemide (LASIX) 20 MG tablet TAKE 1 TABLET BY MOUTH EVERY DAY 12/21/13  Yes Noralee Space, MD  furosemide (LASIX) 20 MG tablet TAKE 1 TABLET BY MOUTH EVERY DAY 01/23/14  Yes Noralee Space, MD  ibuprofen (ADVIL,MOTRIN) 200 MG tablet Take 200 mg by mouth 2 (two) times daily as needed.   Yes Historical Provider, MD  labetalol (NORMODYNE) 200 MG tablet TAKE 2 TABLETS BY MOUTH TWICE DAILY 12/21/13  Yes Noralee Space, MD  labetalol (NORMODYNE) 200 MG tablet TAKE 2 TABLETS BY MOUTH TWICE DAILY 01/03/14  Yes Noralee Space, MD  labetalol (NORMODYNE) 200 MG tablet TAKE 2 TABLETS BY MOUTH TWICE  DAILY 01/03/14  Yes Noralee Space, MD  labetalol (NORMODYNE) 200 MG tablet TAKE 2 TABLETS BY MOUTH TWICE DAILY 01/03/14  Yes Noralee Space, MD  labetalol (NORMODYNE) 200 MG tablet TAKE 2 TABLETS BY MOUTH TWICE DAILY 01/03/14  Yes Noralee Space, MD  losartan (COZAAR) 100 MG tablet TAKE 1 TABLET BY MOUTH EVERY DAY 12/21/13  Yes Noralee Space, MD  losartan (COZAAR) 100 MG tablet TAKE 1 TABLET EVERY DAY 01/08/14  Yes Noralee Space, MD  Multiple Vitamins-Minerals (MULTIVITAMIN WITH MINERALS) tablet Take 1 tablet by mouth daily.     Yes Historical Provider, MD  sertraline (ZOLOFT) 100 MG tablet Take 100 mg by mouth daily.    Yes Historical Provider, MD  TAZTIA XT 360 MG 24 hr capsule TAKE ONE CAPSULE BY MOUTH EVERY DAY 03/15/14  Yes Noralee Space, MD  temazepam (RESTORIL) 30 MG capsule Take 30 mg by mouth at bedtime as needed.     Yes Historical Provider, MD     ROS: The patient denies  night sweats, unintentional weight loss, chest pain, palpitations, wheezing, dyspnea on exertion,  vomiting, abdominal pain, dysuria, hematuria, melena, numbness,  or tingling.   All other systems have been reviewed and were otherwise negative with the exception of those mentioned in the HPI and as above.    PHYSICAL EXAM: Filed Vitals:   05/31/14 1046  BP: 120/70  Pulse: 83  Temp: 99.1 F (37.3 C)  Resp: 16   Filed Vitals:   05/31/14 1046  Height: 5\' 7"  (1.702 m)  Weight: 192 lb 9.6 oz (87.363 kg)   Body mass index is 30.16 kg/(m^2).  General: Alert, no acute distress HEENT:  Normocephalic, atraumatic, oropharynx patent. EOMI, PERRLA Erythematous throat, no exudates, TM normal, + minimal max sinus tenderness, + erythematous/boggy nasal mucosa Cardiovascular:  Regular rate and rhythm, no rubs murmurs or gallops.  No Carotid bruits, radial pulse intact. No pedal edema.  Respiratory: Clear to auscultation bilaterally.  No wheezes, rales, or rhonchi.  No cyanosis, no use of accessory musculature GI: No  organomegaly, abdomen is soft and non-tender, positive bowel sounds.  No masses. Skin: No rashes. Neurologic: Facial musculature symmetric. Psychiatric: Patient is appropriate throughout our interaction. Lymphatic: No cervical lymphadenopathy Musculoskeletal: Gait intact.   LABS: Results for orders placed or performed in visit on 05/31/14  POCT Influenza A/B  Result Value Ref Range   Influenza A, POC Negative  Influenza B, POC Negative      EKG/XRAY:   Primary read interpreted by Dr. Marin Comment at [facility].   ASSESSMENT/PLAN: Encounter Diagnoses  Name Primary?  . Flu-like symptoms Yes  . Acute ethmoidal sinusitis, recurrence not specified    59 y/o female with flu like sxs She has very minimal sinus tenderness almost nonequivical Will treat for flu like sxs with tamiflu eventhough flu swab negative, patient agrees to this after discussion If she has worsening sinus issues with otc nasacort then I will rx an antibiotic Repeat Spo2 was 97-98%.  F/u prn   Gross sideeffects, risk and benefits, and alternatives of medications d/w patient. Patient is aware that all medications have potential sideeffects and we are unable to predict every sideeffect or drug-drug interaction that may occur.  Kingstin Heims, [place], DO 05/31/2014 12:16 PM

## 2014-05-31 NOTE — Patient Instructions (Signed)
Oseltamivir capsules What is this medicine? OSELTAMIVIR (os el TAM i vir) is an antiviral medicine. It is used to prevent and to treat some kinds of influenza or the flu. It will not work for colds or other viral infections. This medicine may be used for other purposes; ask your health care provider or pharmacist if you have questions. COMMON BRAND NAME(S): Tamiflu What should I tell my health care provider before I take this medicine? They need to know if you have any of the following conditions: -heart disease -immune system problems -kidney disease -liver disease -lung disease -an unusual or allergic reaction to oseltamivir, other medicines, foods, dyes, or preservatives -pregnant or trying to get pregnant -breast-feeding How should I use this medicine? Take this medicine by mouth with a glass of water. Follow the directions on the prescription label. Start this medicine at the first sign of flu symptoms. You can take it with or without food. If it upsets your stomach, take it with food. Take your medicine at regular intervals. Do not take your medicine more often than directed. Take all of your medicine as directed even if you think you are better. Do not skip doses or stop your medicine early. Talk to your pediatrician regarding the use of this medicine in children. While this drug may be prescribed for children as young as 14 days for selected conditions, precautions do apply. Overdosage: If you think you have taken too much of this medicine contact a poison control center or emergency room at once. NOTE: This medicine is only for you. Do not share this medicine with others. What if I miss a dose? If you miss a dose, take it as soon as you remember. If it is almost time for your next dose (within 2 hours), take only that dose. Do not take double or extra doses. What may interact with this medicine? Interactions are not expected. This list may not describe all possible interactions. Give  your health care provider a list of all the medicines, herbs, non-prescription drugs, or dietary supplements you use. Also tell them if you smoke, drink alcohol, or use illegal drugs. Some items may interact with your medicine. What should I watch for while using this medicine? Visit your doctor or health care professional for regular check ups. Tell your doctor if your symptoms do not start to get better or if they get worse. If you have the flu, you may be at an increased risk of developing seizures, confusion, or abnormal behavior. This occurs early in the illness, and more frequently in children and teens. These events are not common, but may result in accidental injury to the patient. Families and caregivers of patients should watch for signs of unusual behavior and contact a doctor or health care professional right away if the patient shows signs of unusual behavior. This medicine is not a substitute for the flu shot. Talk to your doctor each year about an annual flu shot. What side effects may I notice from receiving this medicine? Side effects that you should report to your doctor or health care professional as soon as possible: -allergic reactions like skin rash, itching or hives, swelling of the face, lips, or tongue -anxiety, confusion, unusual behavior -breathing problems -hallucination, loss of contact with reality -redness, blistering, peeling or loosening of the skin, including inside the mouth -seizures Side effects that usually do not require medical attention (report to your doctor or health care professional if they continue or are bothersome): -cough -diarrhea -dizziness -  headache -nausea, vomiting -stomach pain This list may not describe all possible side effects. Call your doctor for medical advice about side effects. You may report side effects to FDA at 1-800-FDA-1088. Where should I keep my medicine? Keep out of the reach of children. Store at room temperature between  15 and 30 degrees C (59 and 86 degrees F). Throw away any unused medicine after the expiration date. NOTE: This sheet is a summary. It may not cover all possible information. If you have questions about this medicine, talk to your doctor, pharmacist, or health care provider.  2015, [employer]. (2011-05-01 19:43:38)

## 2014-08-28 ENCOUNTER — Other Ambulatory Visit: Payer: Self-pay | Admitting: Pulmonary Disease

## 2014-09-09 ENCOUNTER — Other Ambulatory Visit: Payer: Self-pay | Admitting: Pulmonary Disease

## 2014-12-03 ENCOUNTER — Other Ambulatory Visit: Payer: Self-pay | Admitting: Pulmonary Disease

## 2014-12-12 ENCOUNTER — Other Ambulatory Visit: Payer: Self-pay | Admitting: Pulmonary Disease

## 2014-12-20 ENCOUNTER — Ambulatory Visit (INDEPENDENT_AMBULATORY_CARE_PROVIDER_SITE_OTHER)
Admission: RE | Admit: 2014-12-20 | Discharge: 2014-12-20 | Disposition: A | Discharge status: Home / Self Care | Payer: BC Managed Care – PPO | Source: Ambulatory Visit | Attending: Pulmonary Disease | Admitting: Pulmonary Disease

## 2014-12-20 ENCOUNTER — Ambulatory Visit (INDEPENDENT_AMBULATORY_CARE_PROVIDER_SITE_OTHER): Payer: BC Managed Care – PPO | Admitting: Pulmonary Disease

## 2014-12-20 ENCOUNTER — Encounter: Payer: Self-pay | Admitting: Pulmonary Disease

## 2014-12-20 VITALS — BP 116/64 | HR 71 | Temp 98.3°F | Wt 192.6 lb

## 2014-12-20 DIAGNOSIS — Z0000 Encounter for general adult medical examination without abnormal findings: Secondary | ICD-10-CM

## 2014-12-20 DIAGNOSIS — F32A Depression, unspecified: Secondary | ICD-10-CM

## 2014-12-20 DIAGNOSIS — E663 Overweight: Secondary | ICD-10-CM | POA: Diagnosis not present

## 2014-12-20 DIAGNOSIS — K219 Gastro-esophageal reflux disease without esophagitis: Secondary | ICD-10-CM

## 2014-12-20 DIAGNOSIS — E7800 Pure hypercholesterolemia, unspecified: Secondary | ICD-10-CM

## 2014-12-20 DIAGNOSIS — I10 Essential (primary) hypertension: Secondary | ICD-10-CM

## 2014-12-20 DIAGNOSIS — M949 Disorder of cartilage, unspecified: Secondary | ICD-10-CM

## 2014-12-20 DIAGNOSIS — F329 Major depressive disorder, single episode, unspecified: Secondary | ICD-10-CM

## 2014-12-20 DIAGNOSIS — J4530 Mild persistent asthma, uncomplicated: Secondary | ICD-10-CM

## 2014-12-20 DIAGNOSIS — M899 Disorder of bone, unspecified: Secondary | ICD-10-CM

## 2014-12-20 DIAGNOSIS — M25562 Pain in left knee: Secondary | ICD-10-CM | POA: Insufficient documentation

## 2014-12-20 DIAGNOSIS — K589 Irritable bowel syndrome without diarrhea: Secondary | ICD-10-CM

## 2014-12-20 NOTE — Patient Instructions (Signed)
Today we updated your med list in our EPIC system...    Continue your current medications the same...  We discussed taking the ADVAIR one inhalation twice daily on a regular basis...    And carrying the AlbuterolHFA rescue inhaler for as needed use...  Today we did your follow up CXR & a breathing test... Please ret to our lab one morning for your FASTING blood work...    We will contact you w/ the results when available...   Keep up the good work w/ diet & exercise...    The goal is to lose 15-20 lbs...  Call for any questions...  Let's plan a follow up visit in 57yr sooner if needed for problems..Marland KitchenMarland Kitchen

## 2014-12-20 NOTE — Progress Notes (Signed)
Subjective:    Patient ID: Monique Weber, female    DOB: Nov 18, 1955, 59 y.o.   MRN: [id number]  HPI 59 y/o WF here for a follow up visit... she has multiple medical problems as noted below...   ~  SEE PREV EPIC NOTES FOR OLDER DATA >>    Last CXR 11/10 showed borderline cardiomeg, clear lungs, irregularity at post part of left hemidiaph w/ sm defect & herniation of intra-abd fat (see 6/07 CTscan).  EKG 3/12 showed NSR, rate75, axis within normal limits, intervals within normal limits, no acute ST-T wave changes...  Nuclear Stress Test 3/12 was neg- no ischemia or infarction, EF=70%...  LABS 4/13:  FLP- at goals;  Chems- wnl;  CBC- wnl;  TSH=1.57...   ~  November 30, 2012:  27moROV & Monique Weber's life continues to be stressful- work, home w/ husb disability, etc;  She had sinus infection, treated w/ Keflex and resolved;  She also had a bout of Iritis- saw Ophthalmology, DrMcCuen, ?detached vitreous, glaucoma w/ surg, & early cats; she requests ACE to be done (normal at 152...     Asthma> no problems over the last yr; she uses the Advair250Bid seasonally & reports that Autumn is the worst due to ragweed etc; also ha ProairHFA for prn use...    HBP> on Labet200-2Bid, Diltiaz360/d, Losartan100, Lasix20/d; BP= 140/80 & she denies CP, palpit, SOB, edema; she knows to avoid caffeine etc w/ her hx of SVT.    Cardiac> seen by DrHochrein 3/12 w/ AtypCP & HBP; she is upset about Myoview that cost her $2500 out of pocket & was neg    Chol> on Lip80 + diet; FLP shows TChol 210, TG 117, HDL 88, LDL 111    Overweight> weight is stable at 195# "I've been juicing"; we reviewed diet, exercise, & wt reduction program...    GI> Hx GERD & IBS> she reports stable; she is off her Prilosec20 but can use it prn symptoms; her last colon was 2007 & neg- f/u 157yr    GYN> followed by DrNeal & up to date; on Premarin & Prometrium; she reports that all is ok...    Ortho> hx bilat shoulder pain, neck pain, foot pain; she  reports all better after Chiropractor Rx & not requiring pain meds; she stretches and uses knee brace for walks...    Anxiety> followed by DrMcKinney on Zoloft100, Xanax0.5-2Qhs; she sees MeRunner, broadcasting/film/video. We reviewed prob list, meds, xrays and labs> see below for updates >>   CXR 7/14 showed normal heart size, clear lungs, DJD Tspine, NAD...  LABS 7/14:  FLP- ok on Lip80;  Chems- wnl;  CBC- wnl;  TSH=1.77;  VitD=50;  ACE=18...  ~  December 21, 2013:  Yearly ROV & CPX> CyDonyaleeports a good yr overall>  She had a total Vaginal [facility] 6[facility]15 [facility] and now fully recovered & feeling better...  Recently she developed a hematoma on her left arm which she believes came from carrying groceries in her left hand; [facility] they did Labs (all wnl), XRay (neg), and VenDopplers (neg for blood clot); it has since resolved, no resid bruising etc...  She has also had f/u w/ Ophthalmology, DrMcCuen w/ hx glaucoma (s/p surg) and cataracts (L>R, pt states needs surg soon)... We reviewed the following medical problems during today's office visit >>     Asthma> no problems over the last yr; she uses the Advair250Bid seasonally &  reports that Autumn is the worst due to ragweed etc; also has ProairHFA for prn use...    HBP> on Labet200-2Bid, Diltiaz360/d, Losartan100, Lasix20/d; BP= 130/80 & she denies CP, palpit, SOB, edema; she knows to avoid caffeine etc w/ her hx of SVT.    Cardiac> seen by DrHochrein 2012 w/ AtypCP & HBP; she is upset about Myoview that cost her $2500 out of pocket & was neg    Chol> on Lip80 + diet; FLP 7/15 showed TChol 214, TG 178, HDL 86, LDL 92... Reviewed low fat diet 7 discussed wt reduction strategies.    Overweight> weight is stable at 192# "I've been juicing"; we reviewed diet, exercise, & wt reduction program...    GI> Hx GERD & IBS> she reports stable; she is off her Prilosec20 but can use it prn symptoms; her last colon  was 2007 & neg- f/u 4yr.    GYN> followed by DElmon Elseon Premarin; [facility] [facility] [place] [place] [facility] and she reports doing satis; Gyn does her mammography & BMD.    Ortho> hx bilat shoulder pain, neck pain, knee pain, foot pain; she reports prev Chiropractor Rx & uses OTCAdvil prn; sees DrACollins for Ortho w/ knee shots that helped; she stretches and uses knee brace prn.     Anxiety> followed by DrMcKinney (recentl retired) on ZTierra Verdeprn; she also sees MRunner, broadcasting/film/videofor counseling... We reviewed prob list, meds, xrays and labs> see below for updates >> We gave her the PREVNAR-13 vaccine today, reminded to get the 2015 Flu vaccine in the fall...  LABS 7/15 in EPIC>  FLP- not at goals w/ TG=178;  Chems- wnl;  CBC- wnl...   ~  December 20, 2014:  [place] cataract surg 11/26/14 (DrMcCuen) & right eye sched for next week; she also [facility] as noted above... Her CC is left knee pain- "it gives out on me all the time" eval & rx by DrACollins- said to have DJD, torn meniscus, torn ACL & getting cortisone shots, PT, & later may need TKR... She reports breathing is OK but the ragweed is tough- using Advair & Proair as needed... We reviewed the following medical problems during today's office visit >> She uses [facility] for some primary care needs...     Asthma> no problems over the last yr; she uses the Advair250Bid seasonally & reports that Autumn is the worst due to ragweed etc; also has ProairHFA for prn use...    HBP> on Labet200-2Bid, Diltiaz360/d, Losartan100, Lasix20/d; BP= 116/64 & she denies CP, palpit, SOB, edema; she knows to avoid caffeine etc w/ her hx of SVT.    Cardiac> seen by DrHochrein 2012 w/ AtypCP & HBP; she is upset about Myoview that cost her $2500 out of pocket & was neg.    Chol> on Lip80 + diet; FLP 7/15 showed TChol 214, TG 178, HDL 86, LDL 92... Reviewed low fat diet & discussed  wt reduction strategies.    Overweight> weight is stable at 192# but she is no longer "juicing"; we reviewed diet, exercise, & wt reduction program...    GI> Hx GERD & IBS> she reports stable; she is off her Prilosec20 but can use it prn symptoms; her last colon was 2007 & neg- f/u 1040yr   GYN> followed by DrNeal on Premarin; [facility] [place] [facility]; Gyn  does her mammography & BMD.    Ortho> hx bilat shoulder pain, neck pain, knee pain, foot pain; she reports prev Chiropractor Rx & uses OTC Advil prn; sees DrACollins for Ortho w/ knee shots that helped; she stretches, getting PT, and uses knee brace prn.     DERM> there is a pos FamHx of melanoma & she still sees Derm regularly for checks...    Anxiety> followed by DrMcKinney (now retired) on [place] prn; she also sees Runner, broadcasting/film/video for counseling... We reviewed prob list, meds, xrays and labs>>   EKG> Jenny Reichmann says she had one at [facility] before her Hyst 10/2013 & it was OK (we do not have copy)...  CXR 12/20/14 showed borderline cardiomeg, clear lungs w/ mild bibasilar atx/scarring, NAD, DJD Tspine...   PFT 12/20/14 showed FVC=2.30 (66%), FRV1=1.76 (64%), %1sec=77, mid-flows are reduced at 59% predicted...  LABS> she will ret for fasting blood work.           Problem List:  ASTHMA (ICD-493.90) - on ADVAIR 250 Bid & PROAIR HFA Prn...  ~  prev on Accolate but was only taking it 1-2 per week therefore discontinued... hx asthmatic bronchitis w/o exac x yrs although incr difficulty in the spring... ~  CXR 11/10 showed borderline cardiomeg, clear lungs, sm left diaph defect... ~  CXR 7/14 showed normal heart size, clear lungs, DJD Tspine, NAD.Marland Kitchen. ~  CXR 8/16 showed borderline cardiomeg, clear lungs w/ mild bibasilar atx/scarring, NAD, DJD Tspine...  ~  currently doing well- denies cough, sputum, hemoptysis, worsening dyspnea,  wheezing, chest pains, snoring, daytime  hypersomnolence, etc...    HYPERTENSION (ICD-401.9) >>  ~  baseline EKG shows NSR, WNL... ~  2DEcho 11/06 was normal- EF= 55-65% & no regional wall motion abn. ~  She notes INTOL to Lisinopril stating it caused her to fall, balance prob, dizzy & anxious; feels much better off this med. ~  3/12:  She had treadmill exercise test showing poor exercise tolerance & was non-diagnostic; then Myoview==> neg w/o ischemia & EF=70% ~  5/12: BP = 136/78 & similar at home... denies HA, fatigue, visual changes, CP, palipit, dizziness, syncope, dyspnea, edema, etc...  ~  3/13: BP= 150/90 & we decided to add LOSARTAN 145m/d as a trial... ~  7/14: on Labet200-2Bid, Diltiaz360/d, Losartan100, Lasix20/d; BP= 140/80 & she denies CP, palpit, SOB, edema; she knows to avoid caffeine etc w/ her hx of SVT. ~  8/15: on Labet200-2Bid, Diltiaz360/d, Losartan100, Lasix20/d; BP= 130/80 & she remains asymptomatic... ~  8/16: on Labet200-2Bid, Diltiaz360/d, Losartan100, Lasix20/d; BP= 116/64 & she denies CP, palpit, SOB, edema; she knows to avoid caffeine etc w/ her hx of SVT.  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 no recent CP, palpit, etc...  HYPERCHOLESTEROLEMIA (ICD-272.0) - on SIMVASTATIN 474mhs + FishOil...  ~  FLGering/08 on Vytor10/40 showed TChol 264, TG 93, HDL 69, LDL 187... change to Simva80. ~  FLP 12/08 on Simva80 showed TChol 208, TG 78, HDL 61, LDL 139... continue med, better diet. ~  FLP 7/10 off med showed TChol 260, TG 85, HDL 96, LDL 156... rec> restart Simva80. ~  FLCambridge1/10 on Simva80 showed TChol 208, TG 89, HDL 80, LDL 130... she decr to Simva40 per Insurance [employer]. ~  FLP 5/11 on Simva40 showed TChol 227, TG 119, HDL 84, LDL 138 ~  FLP 11/11 on Simva40 showed TChol 233, TG 128, HDL 88, LDL 136... rec> ch to  LIP40, later incr to LIP80 by Cards. ~  FLP 5/12 on Lip80 showed TChol 206, TG 109, HDL 80, LDL 113 ~  FLP 3/13 on Lip80 showed TChol 191,  TG 109, HDL 80, LDL 89 ~  FLP 7/14 on Lip80 showed TChol 210, TG 117, HDL 88, LDL 111 ~  FLP 7/15 on Lip80 showed TChol 214, TG 178, HDL 86, LDL 92... Reminded to follow low fat diet & get wt down. ~  FLP 8/16 on Lip80 => pending  OVERWEIGHT (ICD-278.02) - we reviewed diet + exercise program necessary to lose weight. ~  weight 8/08 = 185# ~  weight 7/10 = 201# ~  weight 11/10 = 185# ~  weight 5/11 = 187# ~  weight 11/11 = 191# ~  Weight 5/12 = 198# ~  Weight 3/13 = 194# ~  Weight 7/14 = 195# ~  Weight 7/15 = 192# ~  Weight 8/16 = 192#  Hx of GERD (ICD-530.81) - uses PRILOSEC OTC 27m Prn.  IRRITABLE BOWEL SYNDROME (ICD-564.1) - [facility] ~  colonoscopy 5/07 by DrJohnson was WNL..Marland Kitchen she saw DrNat-Mann after that for post-colonoscopy pain & dx w/ IBS...  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  ~  she sees chiropractor Prn w/ good relief of symptoms....  ~  s/p bilat stress fractures of the feet. ~  8/15: she reports eval by DrACollins, [facility] (states ACL and cartilage tears) w/ shots in left knee which helped... ~  8/16: her CC is her left knee- sees DrACollins for Ortho w/ knee shots that helped; has DJD, torn meniscus, torn ACL & getting shots, PT, & may need TKR- she stretches, getting PT, and uses knee brace prn.   ANXIETY (ICD-300.00) & DEPRESSION (ICD-311) - on ALPRAZOLAM 0.525m takng 2Qhs, ZOLOFT 10034m, & RESTORIL 3m13m prn... eval by DrMcKinney for Psychiatry & prev counselling as well...  Family Hx of MELANOMA (ICD-172.9) - both parents have hx malig melanoma removed- pt sees Derm Q6mo.60moHealth Maintenance:  GYN prev DrCollins, now DrNeal- on Premarin 0.625/ Prometrium (x12d every 3 mo)... also takes Calcium/ MVI/ Vit D 2000 u daily... she [facility]  [facility]... ~  [facility]  [facility]... ~  GYN:  Followed by DrNeaElmon Elseotal vag hyst done 6/15; she is up to date on mammograms and they do her BMD as well (we do not have records from them)... ~  Immuniz:  She gets the yearly Flu vaccines;  She had TDAP 2013;  She was given PREVNAR-13 8/15;  We discussed shingles vaccine...   Past Surgical History  Procedure Laterality Date  . Dilation and curettage of uterus  1980    miscarriage 1980  . Cervical polypectomy  2005  . Abdominal hysterectomy      June 2015    Outpatient Encounter Prescriptions as of 12/20/2014  Medication Sig  . ADVAIR DISKUS 250-50 MCG/DOSE AEPB INHALE 1 PUFF BY MOUTH TWICE DAILY. RINSE MOUTH AFTER USE  . albuterol (PROAIR HFA) 108 (90 BASE) MCG/ACT inhaler Inhale 2 puffs into the lungs every 6 (six) hours as needed for wheezing.  . ALPMarland KitchenAZolam (XANAX) 0.5 MG tablet Take 1 1/2 tablet by mouth at bedtime  . atorvastatin (LIPITOR) 80 MG tablet  TAKE 1 TABLET BY MOUTH EVERY DAY  . Cholecalciferol (VITAMIN D3) 2000 UNITS TABS Take 1 tablet by mouth daily.  . Cranberry Extract 200 MG CAPS Take 1 capsule by mouth daily.  Marland Kitchen diltiazem (TAZTIA XT) 360 MG 24 hr capsule TAKE 1 CAPSULE BY MOUTH EVERY DAY  . estrogens, conjugated, (PREMARIN) 0.625 MG tablet Take 0.625 mg by mouth daily. Take daily for 21 days then do not take for 7 days.   . furosemide (LASIX) 20 MG tablet TAKE 1 TABLET BY MOUTH EVERY DAY  . furosemide (LASIX) 20 MG tablet TAKE 1 TABLET BY MOUTH EVERY DAY  . glucosamine-chondroitin 500-400 MG tablet Take 1 tablet by mouth 3 (three) times daily.  Marland Kitchen ibuprofen (ADVIL,MOTRIN) 200 MG tablet Take 200 mg by mouth 2 (two) times daily as needed.  . labetalol (NORMODYNE) 200 MG tablet TAKE 2 TABLETS BY MOUTH TWICE DAILY  . labetalol (NORMODYNE) 200 MG tablet TAKE 2 TABLETS BY MOUTH TWICE DAILY  . labetalol (NORMODYNE) 200 MG tablet TAKE 2 TABLETS BY MOUTH TWICE DAILY  .  labetalol (NORMODYNE) 200 MG tablet TAKE 2 TABLETS BY MOUTH TWICE DAILY  . labetalol (NORMODYNE) 200 MG tablet TAKE 2 TABLETS BY MOUTH TWICE DAILY  . losartan (COZAAR) 100 MG tablet TAKE 1 TABLET BY MOUTH EVERY DAY  . losartan (COZAAR) 100 MG tablet TAKE 1 TABLET EVERY DAY  . magnesium 30 MG tablet Take 30 mg by mouth daily.  . Multiple Vitamins-Minerals (MULTIVITAMIN WITH MINERALS) tablet Take 1 tablet by mouth daily.    Marland Kitchen oseltamivir (TAMIFLU) 75 MG capsule Take 1 capsule (75 mg total) by mouth 2 (two) times daily.  . sertraline (ZOLOFT) 100 MG tablet Take 100 mg by mouth daily.   Marland Kitchen TAZTIA XT 360 MG 24 hr capsule TAKE ONE CAPSULE BY MOUTH EVERY DAY  . temazepam (RESTORIL) 30 MG capsule Take 30 mg by mouth at bedtime as needed.     No facility-administered encounter medications on file as of 12/20/2014.    No Known Allergies   Current Medications, Allergies, Past Medical History, Past Surgical History, Family History, and Social History were reviewed in [employer] record.  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 Margretta Sidle re: energy therapy, shakra points, meditation, etc...   Review of Systems       See HPI - all other systems neg except as noted... The patient denies anorexia, fever, weight loss, weight gain, vision loss, decreased hearing, hoarseness, chest pain, syncope, dyspnea on exertion, peripheral edema, prolonged cough, headaches, hemoptysis, abdominal pain, melena, hematochezia, severe indigestion/heartburn, hematuria, incontinence, muscle weakness, suspicious skin lesions, transient blindness, difficulty walking, depression, unusual weight change, abnormal bleeding, enlarged lymph nodes, and angioedema.     Objective:   Physical Exam      WD, Overweight, 59 y/o WF in NAD... Vital signs:  Reviewed... GENERAL:  Alert & oriented; pleasant & cooperative. HEENT:  [place]/AT, EOM-wnl, PERRLA, Fundi-benign,  EACs-clear, TMs-wnl, NOSE-clear, THROAT-clear & wnl. NECK:  Supple w/ full ROM; no JVD; normal carotid impulses w/o bruits; no thyromegaly or nodules palpated; no lymphadenopathy. CHEST:  Clear to P & A; without wheezes/ rales/ or rhonchi. HEART:  Regular Rhythm; without murmurs/ rubs/ or gallops. ABDOMEN:  Soft & nontender; normal bowel sounds; no organomegaly or masses detected. EXT: without deformities or arthritic changes; no varicose veins/ venous insuffic/ or edema. NEURO:  CN's intact; motor testing normal; sensory testing normal; gait normal & balance OK.  DERM:  No lesions noted; no rash etc...  RADIOLOGY DATA:  Reviewed in the EPIC EMR & discussed w/ the patient...  LABORATORY DATA:  Reviewed in the EPIC EMR & discussed w/ the patient...   Assessment & Plan:   CPX>>  ASTHMA>  Stable w/o exac, we prefer Rx w/ Advair regularly but she prefers seasonally only,  & Proair rescue as needed...  HBP>  BP seems better controlled on her 4 meds- continue same...  CHOL>  Improved on the Lip80 but still not at goal> needs better low fat diet & get wt down, same med for now...  Overwt>  Wt reduction is the key to improving the other parameters...  Anxiety, Depression>  She continues f/u w/ DrMcKinney & MBaker...   Patient's Medications  New Prescriptions   No medications on file  Previous Medications   ADVAIR DISKUS 250-50 MCG/DOSE AEPB    INHALE 1 PUFF BY MOUTH TWICE DAILY. RINSE MOUTH AFTER USE   ALBUTEROL (PROAIR HFA) 108 (90 BASE) MCG/ACT INHALER    Inhale 2 puffs into the lungs every 6 (six) hours as needed for wheezing.   ALPRAZOLAM (XANAX) 0.5 MG TABLET    Take 1 1/2 tablet by mouth at bedtime   ATORVASTATIN (LIPITOR) 80 MG TABLET    TAKE 1 TABLET BY MOUTH EVERY DAY   CHOLECALCIFEROL (VITAMIN D3) 2000 UNITS TABS    Take 1 tablet by mouth daily.   CRANBERRY EXTRACT 200 MG CAPS    Take 1 capsule by mouth daily.   DILTIAZEM (TAZTIA XT) 360 MG 24 HR CAPSULE    TAKE 1 CAPSULE BY  MOUTH EVERY DAY   ESTROGENS, CONJUGATED, (PREMARIN) 0.625 MG TABLET    Take 0.625 mg by mouth daily. Take daily for 21 days then do not take for 7 days.    FUROSEMIDE (LASIX) 20 MG TABLET    TAKE 1 TABLET BY MOUTH EVERY DAY   GLUCOSAMINE-CHONDROITIN 500-400 MG TABLET    Take 1 tablet by mouth 3 (three) times daily.   IBUPROFEN (ADVIL,MOTRIN) 200 MG TABLET    Take 200 mg by mouth 2 (two) times daily as needed.   LABETALOL (NORMODYNE) 200 MG TABLET    TAKE 2 TABLETS BY MOUTH TWICE DAILY   LOSARTAN (COZAAR) 100 MG TABLET    TAKE 1 TABLET BY MOUTH EVERY DAY   MAGNESIUM 30 MG TABLET    Take 30 mg by mouth daily.   MULTIPLE VITAMINS-MINERALS (MULTIVITAMIN WITH MINERALS) TABLET    Take 1 tablet by mouth daily.     SERTRALINE (ZOLOFT) 100 MG TABLET    Take 100 mg by mouth daily.    TAZTIA XT 360 MG 24 HR CAPSULE    TAKE ONE CAPSULE BY MOUTH EVERY DAY   TEMAZEPAM (RESTORIL) 30 MG CAPSULE    Take 30 mg by mouth at bedtime as needed.    Modified Medications   No medications on file  Discontinued Medications   No medications on file

## 2014-12-27 ENCOUNTER — Other Ambulatory Visit: Payer: Self-pay | Admitting: Emergency Medicine

## 2014-12-27 MED ORDER — FUROSEMIDE 20 MG PO TABS: ORAL_TABLET | ORAL | Status: DC

## 2014-12-28 ENCOUNTER — Other Ambulatory Visit (INDEPENDENT_AMBULATORY_CARE_PROVIDER_SITE_OTHER): Payer: BC Managed Care – PPO

## 2014-12-28 DIAGNOSIS — J4530 Mild persistent asthma, uncomplicated: Secondary | ICD-10-CM | POA: Diagnosis not present

## 2014-12-28 DIAGNOSIS — Z0000 Encounter for general adult medical examination without abnormal findings: Secondary | ICD-10-CM

## 2014-12-28 DIAGNOSIS — E663 Overweight: Secondary | ICD-10-CM | POA: Diagnosis not present

## 2014-12-28 DIAGNOSIS — I10 Essential (primary) hypertension: Secondary | ICD-10-CM | POA: Diagnosis not present

## 2014-12-28 LAB — CBC WITH DIFFERENTIAL/PLATELET
BASOS PCT: 0.4 % (ref 0.0–3.0)
Basophils Absolute: 0 10*3/uL (ref 0.0–0.1)
EOS ABS: 0.1 10*3/uL (ref 0.0–0.7)
EOS PCT: 1.3 % (ref 0.0–5.0)
HEMATOCRIT: 39.8 % (ref 36.0–46.0)
HEMOGLOBIN: 13.3 g/dL (ref 12.0–15.0)
Lymphocytes Relative: 18.2 % (ref 12.0–46.0)
Lymphs Abs: 1.9 10*3/uL (ref 0.7–4.0)
MCHC: 33.4 g/dL (ref 30.0–36.0)
MCV: 93.4 fl (ref 78.0–100.0)
MONO ABS: 0.5 10*3/uL (ref 0.1–1.0)
Monocytes Relative: 5.1 % (ref 3.0–12.0)
NEUTROS ABS: 7.9 10*3/uL — AB (ref 1.4–7.7)
Neutrophils Relative %: 75 % (ref 43.0–77.0)
PLATELETS: 381 10*3/uL (ref 150.0–400.0)
RBC: 4.26 Mil/uL (ref 3.87–5.11)
RDW: 15.2 % (ref 11.5–15.5)
WBC: 10.5 10*3/uL (ref 4.0–10.5)

## 2014-12-28 LAB — HEPATIC FUNCTION PANEL
ALT: 35 U/L (ref 0–35)
AST: 20 U/L (ref 0–37)
Albumin: 3.9 g/dL (ref 3.5–5.2)
Alkaline Phosphatase: 66 U/L (ref 39–117)
BILIRUBIN TOTAL: 0.2 mg/dL (ref 0.2–1.2)
Bilirubin, Direct: 0.1 mg/dL (ref 0.0–0.3)
Total Protein: 6.6 g/dL (ref 6.0–8.3)

## 2014-12-28 LAB — BASIC METABOLIC PANEL
BUN: 16 mg/dL (ref 6–23)
CO2: 30 mEq/L (ref 19–32)
Calcium: 9.2 mg/dL (ref 8.4–10.5)
Chloride: 104 mEq/L (ref 96–112)
Creatinine, Ser: 0.76 mg/dL (ref 0.40–1.20)
GFR: 82.54 mL/min (ref >60.00)
Glucose, Bld: 92 mg/dL (ref 70–99)
POTASSIUM: 3.6 meq/L (ref 3.5–5.1)
SODIUM: 143 meq/L (ref 135–145)

## 2014-12-28 LAB — TSH: TSH: 1.13 u[IU]/mL (ref 0.35–4.50)

## 2014-12-28 LAB — LIPID PANEL
CHOL/HDL RATIO: 3
Cholesterol: 232 mg/dL — ABNORMAL HIGH (ref 0–200)
HDL: 80.7 mg/dL (ref >39.00)
LDL CALC: 131 mg/dL — AB (ref 0–99)
NonHDL: 151.71
Triglycerides: 102 mg/dL (ref 0.0–149.0)
VLDL: 20.4 mg/dL (ref 0.0–40.0)

## 2014-12-28 LAB — VITAMIN D 25 HYDROXY (VIT D DEFICIENCY, FRACTURES): VITD: 31 ng/mL (ref 30.00–100.00)

## 2015-01-01 ENCOUNTER — Telehealth: Payer: Self-pay | Admitting: Pulmonary Disease

## 2015-01-01 NOTE — Telephone Encounter (Signed)
Called and spoke to pt. Pt stated she received a vm from SN and is calling him back regarding her lab work. Offered pt my assistance regarding her lab results but she prefers to speak with SN. Pt can be reached at [phone], no rush per pt.   Dr. Lenna Gilford please advise. Thanks.

## 2015-01-02 NOTE — Telephone Encounter (Signed)
To Dr. Lenna Gilford - have you spoken with this patient yet? Please advise if ok to close encounter

## 2015-01-02 NOTE — Telephone Encounter (Signed)
Per SN, Spoke with pt last night  Nothing further is needed

## 2015-01-09 ENCOUNTER — Other Ambulatory Visit: Payer: Self-pay | Admitting: Pulmonary Disease

## 2015-01-19 ENCOUNTER — Other Ambulatory Visit: Payer: Self-pay | Admitting: Pulmonary Disease

## 2015-01-25 ENCOUNTER — Other Ambulatory Visit: Payer: Self-pay | Admitting: Pulmonary Disease

## 2015-01-25 MED ORDER — LOSARTAN POTASSIUM 100 MG PO TABS: ORAL_TABLET | ORAL | Status: DC

## 2015-02-14 ENCOUNTER — Other Ambulatory Visit: Payer: Self-pay | Admitting: Pulmonary Disease

## 2015-04-07 ENCOUNTER — Other Ambulatory Visit: Payer: Self-pay | Admitting: Pulmonary Disease

## 2015-04-10 ENCOUNTER — Ambulatory Visit (INDEPENDENT_AMBULATORY_CARE_PROVIDER_SITE_OTHER): Payer: BC Managed Care – PPO | Admitting: Family Medicine

## 2015-04-10 ENCOUNTER — Ambulatory Visit (INDEPENDENT_AMBULATORY_CARE_PROVIDER_SITE_OTHER): Payer: BC Managed Care – PPO

## 2015-04-10 VITALS — BP 130/78 | HR 78 | Temp 97.4°F | Resp 16 | Ht 65.5 in | Wt 190.4 lb

## 2015-04-10 DIAGNOSIS — M542 Cervicalgia: Secondary | ICD-10-CM

## 2015-04-10 DIAGNOSIS — W19XXXA Unspecified fall, initial encounter: Secondary | ICD-10-CM | POA: Diagnosis not present

## 2015-04-10 DIAGNOSIS — R0789 Other chest pain: Secondary | ICD-10-CM

## 2015-04-10 DIAGNOSIS — S0990XA Unspecified injury of head, initial encounter: Secondary | ICD-10-CM | POA: Diagnosis not present

## 2015-04-10 DIAGNOSIS — R079 Chest pain, unspecified: Secondary | ICD-10-CM | POA: Diagnosis not present

## 2015-04-10 DIAGNOSIS — S2220XA Unspecified fracture of sternum, initial encounter for closed fracture: Secondary | ICD-10-CM

## 2015-04-10 DIAGNOSIS — S161XXA Strain of muscle, fascia and tendon at neck level, initial encounter: Secondary | ICD-10-CM | POA: Diagnosis not present

## 2015-04-10 DIAGNOSIS — S300XXA Contusion of lower back and pelvis, initial encounter: Secondary | ICD-10-CM

## 2015-04-10 MED ORDER — HYDROCODONE-ACETAMINOPHEN 10-325 MG PO TABS: 1.0000 | ORAL_TABLET | Freq: Four times a day (QID) | ORAL | Status: DC | PRN

## 2015-04-10 MED ORDER — CYCLOBENZAPRINE HCL 5 MG PO TABS: 5.0000 mg | ORAL_TABLET | Freq: Three times a day (TID) | ORAL | Status: DC | PRN

## 2015-04-10 NOTE — Patient Instructions (Signed)
Sternal Fracture The sternum is the bone in the center of the front of your chest which your ribs attach to. It is also called the breastbone. The most common cause of a sternal fracture (break in the bone) is an injury. The most common injury is from a motor vehicle accident. The fracture often comes from the seat belt or hitting the chest on the steering wheel or being forcibly bent forward (shoulders toward your knees) during an accident. It is more common in females and the elderly. The fracture of the sternum is usually not a problem if there are no other injuries. Other injuries that may happen are to the ribs, heart, lungs, and abdominal organs. SYMPTOMS  Common complaints from a fracture of the sternum include:  Shortness of breath.  Pain with breathing or difficulty breathing.  Bruises about the chest.  Tenderness or a cracking sound at the breastbone. DIAGNOSIS  Your caregiver may be able to tell if the sternum is broken by examining you. Other times studies such as X-ray, CAT scan, ultrasound, and nuclear medicine are used to detect a fracture.  TREATMENT   Sternal fractures usually are not serious and if displacement is minimal, no treatment is necessary.  The main concern is with damage to the surrounding structures: ribs, heart, great vessels coming from the heart, and the backbone in the chest area.  Multiple rib fractures may cause breathing difficulties.  Injury to one of the large vessels in the chest may be a threat to life and require immediate surgery.  If injury to the heart or lungs is suspected it may be necessary to stay in the hospital and be monitored.  Other injuries will be treated as needed.  If the pieces of the breastbone are out of normal position, they may need to be reduced (put back in position) and then wired in place or fixed with a plate and screws during an operation. HOME CARE INSTRUCTIONS   Avoid strenuous activity. Be careful during activities  and avoid bumping or reinjuring the injured sternum. Activities that cause pain pull on the fracture site(s) and are best avoided if possible.  Eat a normal, well-balanced diet. Drink plenty of fluids to avoid constipation, a common side effect of pain medications.  Take deep breaths and cough several times a day, splinting the injured area with a pillow. This will help prevent pneumonia.  Do not wear a rib belt or binder for the chest unless instructed otherwise. These restrict breathing and can lead to pneumonia.  Only take over-the-counter or prescription medicines for pain, discomfort, or fever as directed by your caregiver. SEEK MEDICAL CARE IF:  You develop a continual cough, associated with thick or bloody mucus or phlegm (sputum). SEEK IMMEDIATE MEDICAL CARE IF:   You have a fever.  You have increasing difficulty breathing.  You feel sick to your stomach (nausea), vomit, or have abdominal pain.  You have worsening pain, not controlled with medications.  You develop pain in the tops of your shoulders (in the shoulder strap area).  You feel light-headed or faint.  You develop chest pain or an abnormal heartbeat (palpitations).  You develop pain radiating into the jaw, teeth or down the arms.   This information is not intended to replace advice given to you by your health care provider. Make sure you discuss any questions you have with your health care provider.   Document Released: 12/10/2003 Document Revised: 05/18/2014 Document Reviewed: 11/21/2014 Elsevier Interactive Patient Education 2016 [employer]  

## 2015-04-10 NOTE — Progress Notes (Signed)
Subjective:    Patient ID: Monique Weber, female    DOB: 05/12/1955, 59 y.o.   MRN: [id number]  04/10/2015  Fall; Bleeding/Bruising; Tailbone Pain; Chest Pain; and Neck Pain   HPI This 59 y.o. female presents for evaluation of fall.  [place] five days ago; was carrying laundry up the stairs and fell backwards.  Tried to break fall with wrist which does not hurt but is bruised.  Fell back, hit coccyx severely. Bruised sternum with bruise. Also hit door.  Had not driven until today.  Has stayed at home.  Hit head yet no loss of consciousness.  No dizziness, nausea, vomiting, confusion, fever, amnesia.    Neck pain: has been wearing collar due to neck pain. No radiation into arms; no n/t/w in arms.    Sternum pain: horrible bruise.  Sternum really hurts. Pain with deep breathing.  Denies cough or fever.  No exertional component.    Upper back pain: back goes into spasms; having a hard time holding head up.  Taking Naproxen sodium bid.  Applying heat and ice.    Did pick up something today; 50 pounds lifting today.  Fish pond filter clogged so did not want to burn out filter so picked up filter alone.  Had to drag out filter and had to fight with pipes.    Coccyx pain: fine; no moibility issues.    Admits to drinking 2 large liquor drinks per day which is a decrease from the past several years.  Husband expresses concern regarding patient's excessive drinking during the visit.   Review of Systems  Constitutional: Negative for fever, chills, diaphoresis and fatigue.  Eyes: Negative for visual disturbance.  Respiratory: Negative for cough and shortness of breath.   Cardiovascular: Positive for chest pain. Negative for palpitations and leg swelling.  Gastrointestinal: Negative for nausea, vomiting, abdominal pain, diarrhea and constipation.  Endocrine: Negative for cold intolerance, heat intolerance, polydipsia, polyphagia and polyuria.  Musculoskeletal: Positive for myalgias, back pain,  arthralgias, neck pain and neck stiffness. Negative for joint swelling and gait problem.  Neurological: Negative for dizziness, tremors, seizures, syncope, facial asymmetry, speech difficulty, weakness, light-headedness, numbness and headaches.  Psychiatric/Behavioral: Negative for confusion.    Past Medical History  Diagnosis Date  . Unspecified asthma(493.90)   . Unspecified essential hypertension   . Other specified cardiac dysrhythmias(427.89)   . Pure hypercholesterolemia   . Overweight(278.02)   . Esophageal reflux   . Irritable bowel syndrome   . Pain in joint, shoulder region   . Stress fracture of the metatarsals   . Disorder of bone and cartilage, unspecified   . Anxiety state, unspecified   . Depressive disorder, not elsewhere classified   . Melanoma of skin, site unspecified    Past Surgical History  Procedure Laterality Date  . Dilation and curettage of uterus  1980    miscarriage 1980  . Cervical polypectomy  2005  . Abdominal hysterectomy  10/09/2013    June 2015;post-menopausal bleeding; ovaries resected.  . Eye surgery      Cataracts B.   No Known Allergies  Social History   Social History  . Marital Status: Married    Spouse Name: N/A  . Number of Children: 3  . Years of Education: N/A   Occupational History  . student     getting masters in liberal arts   Social History Main Topics  . Smoking status: Former Research scientist (life sciences)  . Smokeless tobacco: Not on file     Comment:  off and on since 1991  . Alcohol Use: Yes     Comment: social use  . Drug Use: Not on file  . Sexual Activity: Not on file   Other Topics Concern  . Not on file   Social History Narrative   Family History  Problem Relation Age of Onset  . Heart disease Father   . Melanoma Father   . Hypertension Mother   . Melanoma Mother   . Arthritis Mother     DJD  . Other Mother     Hashimoto's Encephalopathy       Objective:    BP 130/78 mmHg  Pulse 78  Temp(Src) 97.4 F (36.3 C)  (Oral)  Resp 16  Ht 5' 5.5" (1.664 m)  Wt 190 lb 6.4 oz (86.365 kg)  BMI 31.19 kg/m2  SpO2 95%  LMP 10/23/2012 Physical Exam  Constitutional: She is oriented to person, place, and time. She appears well-developed and well-nourished. No distress.  HENT:  Head: Normocephalic and atraumatic.  Right Ear: External ear normal.  Left Ear: External ear normal.  Nose: Nose normal.  Mouth/Throat: Oropharynx is clear and moist.  Eyes: Conjunctivae and EOM are normal. Pupils are equal, round, and reactive to light.  Neck: Normal range of motion. Neck supple. Carotid bruit is not present. No thyromegaly present.  Cardiovascular: Normal rate, regular rhythm, normal heart sounds and intact distal pulses.  Exam reveals no gallop and no friction rub.   No murmur heard. Pulmonary/Chest: Effort normal and breath sounds normal. She has no wheezes. She has no rales. She exhibits tenderness.  +TTP along sternum midline.  Abdominal: Soft. Bowel sounds are normal. She exhibits no distension and no mass. There is no tenderness. There is no rebound and no guarding.  Musculoskeletal:       Right shoulder: Normal. She exhibits normal range of motion, no tenderness and no bony tenderness.       Left shoulder: Normal. She exhibits normal range of motion, no tenderness, no bony tenderness, no pain, no spasm and normal strength.       Right elbow: Normal.She exhibits normal range of motion, no swelling, no effusion, no deformity and no laceration. No tenderness found. No radial head, no medial epicondyle, no lateral epicondyle and no olecranon process tenderness noted.       Left elbow: Normal. She exhibits normal range of motion, no swelling, no effusion, no deformity and no laceration. No tenderness found. No radial head, no medial epicondyle and no lateral epicondyle tenderness noted.       Right wrist: Normal. She exhibits normal range of motion, no tenderness, no bony tenderness and no swelling.       Left wrist:  Normal. She exhibits normal range of motion, no tenderness, no bony tenderness, no swelling and no effusion.       Cervical back: She exhibits decreased range of motion, tenderness, bony tenderness, pain and spasm. She exhibits no swelling, no edema, no deformity and no laceration.       Lumbar back: She exhibits decreased range of motion, tenderness, bony tenderness, pain and spasm. She exhibits no swelling, no edema and normal pulse.  Lumbar spine:  +tender midline at coccyx; +tender paraspinal regions B.  Straight leg raises negative B; toe and heel walking intact; marching intact; motor 5/5 BLE.  Full ROM lumbar spine with pain. Cervical spine: +tender midline; +tender paraspinal regions B; decreased ROM cervical spine with limitation due to pain.  Motor 5/5 BUE.  Grip 5/5.  Lymphadenopathy:    She has no cervical adenopathy.  Neurological: She is alert and oriented to person, place, and time. No cranial nerve deficit.  Skin: Skin is warm and dry. No rash noted. She is not diaphoretic. No erythema. No pallor.  Psychiatric: She has a normal mood and affect. Her behavior is normal.     [facility] reading (PRIMARY) by  Dr. Tamala Julian.  CERVICAL SPINE: DDD DIFFUSE; NO ACUTE PROCESS.  STERNUM: Possible sternal fracture.   CXR: NAD      Assessment & Plan:   1. Sternal pain   2. Neck pain   3. Coccyx contusion, initial encounter   4. Head trauma, initial encounter   5. Fall, initial encounter   6. Sternal fracture, closed, initial encounter   7. Neck strain, initial encounter     -New. -Rx for Flexeril and hydrocodone provided. -Recommend Naproxen 500mg  bid scheduled for next ten days and then PRN. -Recommend heat and stretching daily. -avoid heavy lifting for the next six weeks. -Recommend frequent deep breaths throughout the day. -No evidence of concussion. -Avoid alcohol while taking hydrocodone and flexeril.  Pt and husband expressed understanding.   Orders Placed This Encounter    Procedures  . DG Cervical Spine Complete    Standing Status: Future     Number of Occurrences: 1     Standing Expiration Date: 04/09/2016    Order Specific Question:  Reason for Exam (SYMPTOM  OR DIAGNOSIS REQUIRED)    Answer:  fall five days ago down stairs backwards; B neck pain and chest pain    Order Specific Question:  Is the patient pregnant?    Answer:  No    Order Specific Question:  Preferred imaging location?    Answer:  External  . DG Sternum    Standing Status: Future     Number of Occurrences: 1     Standing Expiration Date: 04/09/2016    Order Specific Question:  Reason for Exam (SYMPTOM  OR DIAGNOSIS REQUIRED)    Answer:  fall five days ago down stairs backwards; B neck pain and chest pain    Order Specific Question:  Is the patient pregnant?    Answer:  No    Order Specific Question:  Preferred imaging location?    Answer:  External  . DG Chest 1 View    Standing Status: Future     Number of Occurrences: 1     Standing Expiration Date: 04/09/2016    Order Specific Question:  Reason for Exam (SYMPTOM  OR DIAGNOSIS REQUIRED)    Answer:  fall five days ago down stairs backwards; B neck pain and chest pain    Order Specific Question:  Is the patient pregnant?    Answer:  No    Order Specific Question:  Preferred imaging location?    Answer:  External   Meds ordered this encounter  Medications  . cyclobenzaprine (FLEXERIL) 5 MG tablet    Sig: Take 1 tablet (5 mg total) by mouth 3 (three) times daily as needed for muscle spasms.    Dispense:  60 tablet    Refill:  0  . HYDROcodone-acetaminophen (NORCO) 10-325 MG tablet    Sig: Take 1 tablet by mouth every 6 (six) hours as needed.    Dispense:  60 tablet    Refill:  0    No Follow-up on file.    Kristi Elayne Guerin, M.D. [facility] [address] [place], [place]  [address] [phone] phone [phone] fax

## 2015-05-14 ENCOUNTER — Other Ambulatory Visit: Payer: Self-pay | Admitting: Family Medicine

## 2015-05-16 NOTE — Telephone Encounter (Signed)
Dr Smith, do you want to give RFs? 

## 2015-05-17 ENCOUNTER — Other Ambulatory Visit: Payer: Self-pay | Admitting: Pulmonary Disease

## 2015-05-20 NOTE — Telephone Encounter (Signed)
Refills approved.

## 2015-06-06 ENCOUNTER — Other Ambulatory Visit: Payer: Self-pay | Admitting: Pulmonary Disease

## 2015-06-20 ENCOUNTER — Other Ambulatory Visit: Payer: Self-pay | Admitting: Pulmonary Disease

## 2015-07-03 ENCOUNTER — Other Ambulatory Visit: Payer: Self-pay | Admitting: Pulmonary Disease

## 2015-07-19 ENCOUNTER — Telehealth: Payer: Self-pay | Admitting: Pulmonary Disease

## 2015-07-19 MED ORDER — HYDRALAZINE HCL 25 MG PO TABS: 25.0000 mg | ORAL_TABLET | Freq: Two times a day (BID) | ORAL | Status: DC

## 2015-07-19 NOTE — Telephone Encounter (Signed)
Per SN: continue medications the same, need to add hydralazine 25 mg 1 po BID No salt in her diet, work on weight loss Can increase her xanax to 1 po TID prn d/t recent loss of mother Add on to see SN next Friday for appt for f/u  On BP/starting new medication ---  Called spoke with pt. Aware of SN recs. She needed RX sent to [facility] (I have done so). appt scheduled for 3/17 at 9 am to f/u with SN. Nothing further needed

## 2015-07-19 NOTE — Telephone Encounter (Signed)
Called spoke with pt. She reports her mother passed away 1 week ago and has been under a lot of stress. She also had to have a procedure done on Tuesday PRP in knee. Had to drink 64 ounces of water. Yesterday BP was running 193/101, 169/95 abd tiday 171/105 and 145/83. Pt denies any HA, blurred vision, CP.  Reports she did take an extra lasix last night. Requesting further recs. Please advise SN thanks  No Known Allergies   Current Outpatient Prescriptions on File Prior to Visit  Medication Sig Dispense Refill  . ADVAIR DISKUS 250-50 MCG/DOSE AEPB INHALE 1 PUFF BY MOUTH TWICE DAILY( RINSE MOUTH AFTER USE) 1 each 3  . albuterol (PROAIR HFA) 108 (90 BASE) MCG/ACT inhaler Inhale 2 puffs into the lungs every 6 (six) hours as needed for wheezing. 3 Inhaler 3  . ALPRAZolam (XANAX) 0.5 MG tablet Take 1 1/2 tablet by mouth at bedtime    . atorvastatin (LIPITOR) 80 MG tablet TAKE 1 TABLET BY MOUTH EVERY DAY 90 tablet 3  . atorvastatin (LIPITOR) 80 MG tablet TAKE 1 TABLET BY MOUTH EVERY DAY 90 tablet 0  . Cholecalciferol (VITAMIN D3) 2000 UNITS TABS Take 1 tablet by mouth daily.    . Cranberry Extract 200 MG CAPS Take 1 capsule by mouth daily.    . cyclobenzaprine (FLEXERIL) 5 MG tablet TAKE 1 TABLET BY MOUTH THREE TIMES DAILY AS NEEDED FOR MUSCLE SPASMS 60 tablet 3  . diltiazem (TAZTIA XT) 360 MG 24 hr capsule TAKE 1 CAPSULE BY MOUTH EVERY DAY 90 capsule 3  . estrogens, conjugated, (PREMARIN) 0.625 MG tablet Take 0.625 mg by mouth daily. Take daily for 21 days then do not take for 7 days.     . furosemide (LASIX) 20 MG tablet TAKE 1 TABLET BY MOUTH EVERY DAY 30 tablet 5  . glucosamine-chondroitin 500-400 MG tablet Take 1 tablet by mouth 3 (three) times daily.    Marland Kitchen HYDROcodone-acetaminophen (NORCO) 10-325 MG tablet Take 1 tablet by mouth every 6 (six) hours as needed. 60 tablet 0  . ibuprofen (ADVIL,MOTRIN) 200 MG tablet Take 200 mg by mouth 2 (two) times daily as needed.    . labetalol (NORMODYNE)  200 MG tablet TAKE 2 TABLETS BY MOUTH TWICE DAILY 360 tablet 0  . losartan (COZAAR) 100 MG tablet TAKE 1 TABLET EVERY DAY 90 tablet 0  . magnesium 30 MG tablet Take 30 mg by mouth daily.    . Multiple Vitamins-Minerals (MULTIVITAMIN WITH MINERALS) tablet Take 1 tablet by mouth daily.      . sertraline (ZOLOFT) 100 MG tablet Take 100 mg by mouth daily.     . temazepam (RESTORIL) 30 MG capsule Take 30 mg by mouth at bedtime as needed.       No current facility-administered medications on file prior to visit.

## 2015-07-26 ENCOUNTER — Ambulatory Visit (INDEPENDENT_AMBULATORY_CARE_PROVIDER_SITE_OTHER): Payer: BC Managed Care – PPO | Admitting: Pulmonary Disease

## 2015-07-26 ENCOUNTER — Encounter: Payer: Self-pay | Admitting: Pulmonary Disease

## 2015-07-26 VITALS — BP 132/78 | HR 66 | Temp 98.4°F | Ht 66.0 in | Wt 193.4 lb

## 2015-07-26 DIAGNOSIS — I10 Essential (primary) hypertension: Secondary | ICD-10-CM | POA: Diagnosis not present

## 2015-07-26 DIAGNOSIS — M25562 Pain in left knee: Secondary | ICD-10-CM

## 2015-07-26 NOTE — Patient Instructions (Signed)
Today we updated your med list in our EPIC system...    Continue your current medications the same...  The low-dose hydralazine added to your other BP meds has improved your pressure...    Continue to monitor your BP at home...  Call for any questions...  Let's plan a follow up visit in several months as planned & sooner if needed for problems.Marland KitchenMarland Kitchen

## 2015-07-27 ENCOUNTER — Encounter: Payer: Self-pay | Admitting: Pulmonary Disease

## 2015-07-27 NOTE — Progress Notes (Signed)
Subjective:    Patient ID: Monique Weber, female    DOB: 20-Jan-1956, 60 y.o.   MRN: [id number]  HPI 60 y/o WF here for a follow up visit... she has multiple medical problems as noted below...   ~  SEE PREV EPIC NOTES FOR OLDER DATA >>    Last CXR 11/10 showed borderline cardiomeg, clear lungs, irregularity at post part of left hemidiaph w/ sm defect & herniation of intra-abd fat (see 6/07 CTscan).  EKG 3/12 showed NSR, rate75, axis within normal limits, intervals within normal limits, no acute ST-T wave changes...  Nuclear Stress Test 3/12 was neg- no ischemia or infarction, EF=70%...  LABS 4/13:  FLP- at goals;  Chems- wnl;  CBC- wnl;  TSH=1.57...   CXR 7/14 showed normal heart size, clear lungs, DJD Tspine, NAD...  LABS 7/14:  FLP- ok on Lip80;  Chems- wnl;  CBC- wnl;  TSH=1.77;  VitD=50;  ACE=18...  ~  December 21, 2013:  Yearly ROV & CPX> Nyna reports a good yr overall>  She had a total Vaginal Hysterectomy 6/15 from DrNeal (at the [facility]- no records available) and now fully recovered & feeling better...  Recently she developed a hematoma on her left arm which she believes came from carrying groceries in her left hand; she was seen at the [facility] x2, they did Labs (all wnl), XRay (neg), and VenDopplers (neg for blood clot); it has since resolved, no resid bruising etc...  She has also had f/u w/ Ophthalmology, DrMcCuen w/ hx glaucoma (s/p surg) and cataracts (L>R, pt states needs surg soon)... We reviewed the following medical problems during today's office visit >>     Asthma> no problems over the last yr; she uses the Advair250Bid seasonally & reports that Autumn is the worst due to ragweed etc; also has ProairHFA for prn use...    HBP> on Labet200-2Bid, Diltiaz360/d, Losartan100, Lasix20/d; BP= 130/80 & she denies CP, palpit, SOB, edema; she knows to avoid caffeine etc w/ her hx of SVT.    Cardiac> seen by DrHochrein 2012 w/ AtypCP & HBP; she is upset about Myoview that cost her  $2500 out of pocket & was neg    Chol> on Lip80 + diet; FLP 7/15 showed TChol 214, TG 178, HDL 86, LDL 92... Reviewed low fat diet 7 discussed wt reduction strategies.    Overweight> weight is stable at 192# "I've been juicing"; we reviewed diet, exercise, & wt reduction program...    GI> Hx GERD & IBS> she reports stable; she is off her Prilosec20 but can use it prn symptoms; her last colon was 2007 & neg- f/u 76yr.    GYN> followed by DElmon Elseon Premarin; [place] and she reports doing satis; Gyn does her mammography & BMD.    Ortho> hx bilat shoulder pain, neck pain, knee pain, foot pain; she reports prev Chiropractor Rx & uses OTCAdvil prn; sees DrACollins for Ortho w/ knee shots that helped; she stretches and uses knee brace prn.     Anxiety> followed by DrMcKinney (recentl retired) on [place]n; she also sees MRunner, broadcasting/film/videofor counseling... We reviewed prob list, meds, xrays and labs> see below for updates >> We gave her the PREVNAR-13 vaccine today, reminded to get the 2015 Flu vaccine in the fall...  LABS 7/15 in EPIC>  FLP- not at goals w/ TG=178;  Chems- wnl;  CBC- wnl...   ~  December 20, 2014:  60[place]/ [place] cataract surg 11/26/14 (DrMcCuen) & right eye sched for next week; [facility]... Her CC is left knee pain- "it gives out on me all the time" eval & rx by DrACollins- said to have DJD, torn meniscus, torn ACL & getting cortisone shots, PT, & later may need TKR... She reports breathing is OK but the ragweed is tough- using Advair & Proair as needed... We reviewed the following medical problems during today's office visit >> She uses [facility] for some primary care needs...     Asthma> no problems over the last yr; she uses the Advair250Bid seasonally & reports that Autumn is the worst due to ragweed etc; also has ProairHFA for prn use...     HBP> on Labet200-2Bid, Diltiaz360/d, Losartan100, Lasix20/d; BP= 116/64 & she denies CP, palpit, SOB, edema; she knows to avoid caffeine etc w/ her hx of SVT.    Cardiac> seen by DrHochrein 2012 w/ AtypCP & HBP; she is upset about Myoview that cost her $2500 out of pocket & was neg.    Chol> on Lip80 + diet; FLP 7/15 showed TChol 214, TG 178, HDL 86, LDL 92... Reviewed low fat diet & discussed wt reduction strategies.    Overweight> weight is stable at 192# but she is no longer "juicing"; we reviewed diet, exercise, & wt reduction program...    GI> Hx GERD & IBS> she reports stable; she is off her Prilosec20 but can use it prn symptoms; her last colon was 2007 & neg- f/u 140yr    GYN> followed by DrElmon Elsen Premarin; [place]; Gyn does her mammography & BMD.    Ortho> hx bilat shoulder pain, neck pain, knee pain, foot pain; she reports prev Chiropractor Rx & uses OTC Advil prn; sees DrACollins for Ortho w/ knee shots that helped; she stretches, getting PT, and uses knee brace prn.     DERM> there is a pos FamHx of melanoma & she still sees Derm regularly for checks...    Anxiety> followed by DrMcKinney (now retired) on Zo[place] Stationrn; she also sees MeRunner, broadcasting/film/videoor counseling...    Ophthalmology> ?detached vitreous, glaucoma w/ surg, & cats=> left eye cataract surg 11/26/14 (DrMcCuen) & right eye sched soon. We reviewed prob list, meds, xrays and labs>>   EKG> CiJenny Reichmannays she had one at [facility] before her Hyst 10/2013 & it was OK (we do not have copy)...  CXR 12/20/14 showed borderline cardiomeg, clear lungs w/ mild bibasilar atx/scarring, NAD, DJD Tspine...   PFT 12/20/14 showed FVC=2.30 (66%), FEV1=1.76 (64%), %1sec=77, mid-flows are reduced at 59% predicted...  LABS> she will ret for fasting blood work.  ~  July 26, 2015:  26m62moV & Cindy called recently reporting mother passed away, incr stress, and BP up to  190/100 on her 4 meds; we added low dose Hyralazine 64m80m & she is improved- BP= 132/78 today & feeling well- HAs resolved and no CP, palpit, dizzy, SOB, edema... Her daugh is a nursMarine scientistking in cardiac rehab at [facility]indJenny Reichmanninto alternative therapies and is seeing DrDalton-Bethea, pain specialist, for PRP treatments for her knee arthritis- platelet rich plasma injections into her knee (right improved and left pending)... She uses [facility] for primary needs.    Asthma> no problems over the last yr; she uses the Advair250Bid seasonally &  reports that Autumn is the worst due to ragweed etc; also has ProairHFA for prn use...    HBP> on Labet200-2Bid, Diltiaz360/d, Losartan100, Lasix20/d & Apres25Bid now; BP= 132.78 & she denies CP, palpit, SOB, edema; she knows to avoid caffeine etc w/ her hx of SVT.    Cardiac> seen by DrHochrein 2012 w/ AtypCP & HBP (she was upset about Myoview that cost her $2500 out of pocket & was neg).    Chol> on Lip80 + diet; FLP 8/16 showed TChol 232, TG 102, HDL 81, LDL 131... Needs better medication compliance & discussed wt reduction strategies.    Overweight> weight is stable at 193# but she is no longer "juicing"; we reviewed diet, exercise, & wt reduction program... EXAM shows Afeb, VSS, O2sat=98% on RA;  HEENT- neg, mallampati1;  Chest- clear w/o w/r/r;  Heart- RR w/o m/r/g;  Abd- soft, neg;  Ext- w/o c/c/e;  Neuro- intact...  IMP/PLAN>>  BP improved w/ the addition of Apresoline- continue all 5 meds & monitor BP at home...           Problem List:  ASTHMA (ICD-493.90) - on ADVAIR 250 Bid & PROAIR HFA Prn...  ~  prev on Accolate but was only taking it 1-2 per week therefore discontinued... hx asthmatic bronchitis w/o exac x yrs although incr difficulty in the spring... ~  CXR 11/10 showed borderline cardiomeg, clear lungs, sm left diaph defect... ~  CXR 7/14 showed normal heart size, clear lungs, DJD Tspine, NAD.Marland Kitchen. ~  CXR 8/16 showed borderline cardiomeg, clear lungs w/  mild bibasilar atx/scarring, NAD, DJD Tspine...  ~  PFT 12/20/14 showed FVC=2.30 (66%), FEV1=1.76 (64%), %1sec=77, mid-flows are reduced at 59% predicted... ~  currently doing well- denies cough, sputum, hemoptysis, worsening dyspnea,  wheezing, chest pains, snoring, daytime hypersomnolence, etc...    HYPERTENSION (ICD-401.9) >>  ~  baseline EKG shows NSR, WNL... ~  2DEcho 11/06 was normal- EF= 55-65% & no regional wall motion abn. ~  She notes INTOL to Lisinopril stating it caused her to fall, balance prob, dizzy & anxious; feels much better off this med. ~  3/12:  She had treadmill exercise test showing poor exercise tolerance & was non-diagnostic; then Myoview==> neg w/o ischemia & EF=70% ~  5/12: BP = 136/78 & similar at home... denies HA, fatigue, visual changes, CP, palipit, dizziness, syncope, dyspnea, edema, etc...  ~  3/13: BP= 150/90 & we decided to add LOSARTAN 169m/d as a trial... ~  7/14: on Labet200-2Bid, Diltiaz360/d, Losartan100, Lasix20/d; BP= 140/80 & she denies CP, palpit, SOB, edema; she knows to avoid caffeine etc w/ her hx of SVT. ~  8/15: on Labet200-2Bid, Diltiaz360/d, Losartan100, Lasix20/d; BP= 130/80 & she remains asymptomatic... ~  8/16: on Labet200-2Bid, Diltiaz360/d, Losartan100, Lasix20/d; BP= 116/64 & she denies CP, palpit, SOB, edema; she knows to avoid caffeine etc w/ her hx of SVT. ~  3/17: on Labet200-2Bid, Diltiaz360/d, Losartan100, Lasix20/d & Apres25Bid now; BP= 132/78 & HAs resolved & she denies CP, palpit, SOB, edema...  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 no recent CP, palpit, etc...  HYPERCHOLESTEROLEMIA (ICD-272.0) - on SIMVASTATIN 455mhs + FishOil...  ~  FLWardell/08 on Vytor10/40 showed TChol 264, TG 93, HDL 69, LDL 187... change to Simva80. ~  FLP 12/08 on Simva80 showed TChol 208, TG 78, HDL 61, LDL 139... continue med, better diet. ~  FLP 7/10 off med showed TChol 260, TG 85, HDL 96,  LDL 156... rec> restart  Simva80. ~  [place] 11/10 on Simva80 showed TChol 208, TG 89, HDL 80, LDL 130... she decr to Simva40 per Insurance [employer]. ~  FLP 5/11 on Simva40 showed TChol 227, TG 119, HDL 84, LDL 138 ~  FLP 11/11 on Simva40 showed TChol 233, TG 128, HDL 88, LDL 136... rec> ch to LIP40, later incr to LIP80 by Cards. ~  FLP 5/12 on Lip80 showed TChol 206, TG 109, HDL 80, LDL 113 ~  FLP 3/13 on Lip80 showed TChol 191, TG 109, HDL 80, LDL 89 ~  FLP 7/14 on Lip80 showed TChol 210, TG 117, HDL 88, LDL 111 ~  FLP 7/15 on Lip80 showed TChol 214, TG 178, HDL 86, LDL 92... Reminded to follow low fat diet & get wt down. ~  FLP 8/16 on Lip80 => pending  OVERWEIGHT (ICD-278.02) - we reviewed diet + exercise program necessary to lose weight. ~  weight 8/08 = 185# ~  weight 7/10 = 201# ~  weight 11/10 = 185# ~  weight 5/11 = 187# ~  weight 11/11 = 191# ~  Weight 5/12 = 198# ~  Weight 3/13 = 194# ~  Weight 7/14 = 195# ~  Weight 7/15 = 192# ~  Weight 8/16 = 192#  Hx of GERD (ICD-530.81) - uses PRILOSEC OTC 7m Prn.  IRRITABLE BOWEL SYNDROME (ICD-564.1) - prev [facility]Johnson [facility] ~  colonoscopy 5/07 by DrJohnson was WNL..Marland Kitchen she saw DrNat-Mann after that for post-colonoscopy pain & dx w/ IBS...  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  ~  she sees chiropractor Prn w/ good relief of symptoms....  ~  s/p bilat stress fractures of the feet. ~  8/15: she reports eval by DrACollins, [facility] (states ACL and cartilage tears) w/ shots in left knee which helped... ~  8/16: her CC is her left knee- sees DrACollins for Ortho w/ knee shots that helped; has DJD, torn meniscus, torn ACL & getting shots, PT, & may need TKR- she stretches, getting PT, and uses knee brace prn.   ANXIETY (ICD-300.00) & DEPRESSION  (ICD-311) - on ALPRAZOLAM 0.519m takng 2Qhs, ZOLOFT 10059m, & RESTORIL 1m73m prn... eval by DrMcKinney for Psychiatry & prev counselling as well...  Family Hx of MELANOMA (ICD-172.9) - both parents have hx malig melanoma removed- pt sees Derm Q6mo.16moHealth Maintenance:  GYN prev DrCollins, now DrNeal- on Premarin 0.625/ Prometrium (x12d every 3 mo)... also takes Calcium/ MVI/ Vit D 2000 u daily... [facility]... ~  [facility]  [facility]... ~  GYN:  Followed by DrNeaElmon Elseotal vag hyst done 6/15; she is up to date on mammograms and they do her BMD as well (we do not have records from them)... ~  Immuniz:  She gets the yearly Flu vaccines;  She had TDAP 2013;  She was given PREVNAR-13 8/15;  We discussed shingles vaccine...   Past Surgical History  Procedure Laterality Date  . Dilation and curettage of uterus  1980    miscarriage 1980  . Cervical polypectomy  2005  . Abdominal hysterectomy  10/09/2013    June 2015;post-menopausal bleeding; ovaries resected.  . Eye surgery      Cataracts B.    Outpatient Encounter Prescriptions as  of 07/26/2015  Medication Sig  . ADVAIR DISKUS 250-50 MCG/DOSE AEPB INHALE 1 PUFF BY MOUTH TWICE DAILY( RINSE MOUTH AFTER USE)  . albuterol (PROAIR HFA) 108 (90 BASE) MCG/ACT inhaler Inhale 2 puffs into the lungs every 6 (six) hours as needed for wheezing.  Marland Kitchen ALPRAZolam (XANAX) 0.5 MG tablet Take 1 1/2 tablet by mouth at bedtime  . atorvastatin (LIPITOR) 80 MG tablet TAKE 1 TABLET BY MOUTH EVERY DAY  . Cholecalciferol (VITAMIN D3) 2000 UNITS TABS Take 1 tablet by mouth daily.  . Cranberry Extract 200 MG CAPS Take 1 capsule by mouth daily.  . cyclobenzaprine (FLEXERIL) 5 MG tablet TAKE 1 TABLET BY MOUTH THREE TIMES DAILY AS NEEDED FOR MUSCLE SPASMS  . diltiazem (TAZTIA XT) 360 MG 24 hr capsule TAKE 1 CAPSULE BY MOUTH EVERY DAY  . estrogens, conjugated, (PREMARIN) 0.625 MG tablet Take 0.625  mg by mouth daily. Take daily for 21 days then do not take for 7 days.   . furosemide (LASIX) 20 MG tablet TAKE 1 TABLET BY MOUTH EVERY DAY  . glucosamine-chondroitin 500-400 MG tablet Take 1 tablet by mouth daily.   . hydrALAZINE (APRESOLINE) 25 MG tablet Take 1 tablet (25 mg total) by mouth 2 (two) times daily.  Marland Kitchen ibuprofen (ADVIL,MOTRIN) 200 MG tablet Take 200 mg by mouth 2 (two) times daily as needed.  . labetalol (NORMODYNE) 200 MG tablet TAKE 2 TABLETS BY MOUTH TWICE DAILY  . losartan (COZAAR) 100 MG tablet TAKE 1 TABLET EVERY DAY  . magnesium 30 MG tablet Take 30 mg by mouth daily.  . Multiple Vitamins-Minerals (MULTIVITAMIN WITH MINERALS) tablet Take 1 tablet by mouth daily.    . sertraline (ZOLOFT) 100 MG tablet Take 100 mg by mouth daily.   . temazepam (RESTORIL) 30 MG capsule Take 30 mg by mouth at bedtime as needed.    . [DISCONTINUED] HYDROcodone-acetaminophen (NORCO) 10-325 MG tablet Take 1 tablet by mouth every 6 (six) hours as needed.  . [DISCONTINUED] atorvastatin (LIPITOR) 80 MG tablet TAKE 1 TABLET BY MOUTH EVERY DAY   No facility-administered encounter medications on file as of 07/26/2015.    No Known Allergies   Current Medications, Allergies, Past Medical History, Past Surgical History, Family History, and Social History were reviewed in [employer] record.  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 Margretta Sidle re: energy therapy, shakra points, meditation, etc...   Review of Systems       See HPI - all other systems neg except as noted... The patient denies anorexia, fever, weight loss, weight gain, vision loss, decreased hearing, hoarseness, chest pain, syncope, dyspnea on exertion, peripheral edema, prolonged cough, headaches, hemoptysis, abdominal pain, melena, hematochezia, severe indigestion/heartburn, hematuria, incontinence, muscle weakness, suspicious skin lesions, transient blindness,  difficulty walking, depression, unusual weight change, abnormal bleeding, enlarged lymph nodes, and angioedema.     Objective:   Physical Exam      WD, Overweight, 60 y/o WF in NAD... Vital signs:  Reviewed... GENERAL:  Alert & oriented; pleasant & cooperative. HEENT:  [place]/AT, EOM-wnl, PERRLA, Fundi-benign, EACs-clear, TMs-wnl, NOSE-clear, THROAT-clear & wnl. NECK:  Supple w/ full ROM; no JVD; normal carotid impulses w/o bruits; no thyromegaly or nodules palpated; no lymphadenopathy. CHEST:  Clear to P & A; without wheezes/ rales/ or rhonchi. HEART:  Regular Rhythm; without murmurs/ rubs/ or gallops. ABDOMEN:  Soft & nontender; normal bowel sounds; no organomegaly or masses detected. EXT: without deformities or arthritic changes; no  varicose veins/ venous insuffic/ or edema. NEURO:  CN's intact; motor testing normal; sensory testing normal; gait normal & balance OK. DERM:  No lesions noted; no rash etc...  RADIOLOGY DATA:  Reviewed in the EPIC EMR & discussed w/ the patient...  LABORATORY DATA:  Reviewed in the EPIC EMR & discussed w/ the patient...   Assessment & Plan:   CPX>>  ASTHMA>  Stable w/o exac, we prefer Rx w/ Advair regularly but she prefers seasonally only,  & Proair rescue as needed...  HBP>  BP seems better controlled on her 5 med regimen- continue same...  CHOL>  Improved on the Lip80 but still not at goal> needs better low fat diet & get wt down, same med for now...  Overwt>  Wt reduction is the key to improving the other parameters...  Anxiety, Depression>  She continues f/u w/ DrMcKinney & MBaker...   Patient's Medications  New Prescriptions   No medications on file  Previous Medications   ADVAIR DISKUS 250-50 MCG/DOSE AEPB    INHALE 1 PUFF BY MOUTH TWICE DAILY( RINSE MOUTH AFTER USE)   ALBUTEROL (PROAIR HFA) 108 (90 BASE) MCG/ACT INHALER    Inhale 2 puffs into the lungs every 6 (six) hours as needed for wheezing.   ALPRAZOLAM (XANAX) 0.5 MG TABLET    Take  1 1/2 tablet by mouth at bedtime   ATORVASTATIN (LIPITOR) 80 MG TABLET    TAKE 1 TABLET BY MOUTH EVERY DAY   CHOLECALCIFEROL (VITAMIN D3) 2000 UNITS TABS    Take 1 tablet by mouth daily.   CRANBERRY EXTRACT 200 MG CAPS    Take 1 capsule by mouth daily.   CYCLOBENZAPRINE (FLEXERIL) 5 MG TABLET    TAKE 1 TABLET BY MOUTH THREE TIMES DAILY AS NEEDED FOR MUSCLE SPASMS   DILTIAZEM (TAZTIA XT) 360 MG 24 HR CAPSULE    TAKE 1 CAPSULE BY MOUTH EVERY DAY   ESTROGENS, CONJUGATED, (PREMARIN) 0.625 MG TABLET    Take 0.625 mg by mouth daily. Take daily for 21 days then do not take for 7 days.    FUROSEMIDE (LASIX) 20 MG TABLET    TAKE 1 TABLET BY MOUTH EVERY DAY   GLUCOSAMINE-CHONDROITIN 500-400 MG TABLET    Take 1 tablet by mouth daily.    HYDRALAZINE (APRESOLINE) 25 MG TABLET    Take 1 tablet (25 mg total) by mouth 2 (two) times daily.   IBUPROFEN (ADVIL,MOTRIN) 200 MG TABLET    Take 200 mg by mouth 2 (two) times daily as needed.   LABETALOL (NORMODYNE) 200 MG TABLET    TAKE 2 TABLETS BY MOUTH TWICE DAILY   LOSARTAN (COZAAR) 100 MG TABLET    TAKE 1 TABLET EVERY DAY   MAGNESIUM 30 MG TABLET    Take 30 mg by mouth daily.   MULTIPLE VITAMINS-MINERALS (MULTIVITAMIN WITH MINERALS) TABLET    Take 1 tablet by mouth daily.     SERTRALINE (ZOLOFT) 100 MG TABLET    Take 100 mg by mouth daily.    TEMAZEPAM (RESTORIL) 30 MG CAPSULE    Take 30 mg by mouth at bedtime as needed.    Modified Medications   No medications on file  Discontinued Medications   ATORVASTATIN (LIPITOR) 80 MG TABLET    TAKE 1 TABLET BY MOUTH EVERY DAY   HYDROCODONE-ACETAMINOPHEN (NORCO) 10-325 MG TABLET    Take 1 tablet by mouth every 6 (six) hours as needed.

## 2015-08-06 ENCOUNTER — Other Ambulatory Visit: Payer: Self-pay | Admitting: Emergency Medicine

## 2015-08-06 MED ORDER — ALBUTEROL SULFATE HFA 108 (90 BASE) MCG/ACT IN AERS: 2.0000 | INHALATION_SPRAY | Freq: Four times a day (QID) | RESPIRATORY_TRACT | Status: AC | PRN

## 2015-08-08 ENCOUNTER — Other Ambulatory Visit: Payer: Self-pay | Admitting: Pulmonary Disease

## 2015-08-17 ENCOUNTER — Other Ambulatory Visit: Payer: Self-pay | Admitting: Pulmonary Disease

## 2015-09-03 ENCOUNTER — Other Ambulatory Visit: Payer: Self-pay | Admitting: Pulmonary Disease

## 2015-09-07 ENCOUNTER — Other Ambulatory Visit: Payer: Self-pay | Admitting: Pulmonary Disease

## 2015-09-18 ENCOUNTER — Other Ambulatory Visit: Payer: Self-pay | Admitting: Pulmonary Disease

## 2015-09-27 ENCOUNTER — Ambulatory Visit (INDEPENDENT_AMBULATORY_CARE_PROVIDER_SITE_OTHER): Payer: BC Managed Care – PPO | Admitting: Physician Assistant

## 2015-09-27 VITALS — BP 108/62 | HR 79 | Temp 101.3°F | Resp 16 | Ht 65.0 in | Wt 188.0 lb

## 2015-09-27 DIAGNOSIS — R6889 Other general symptoms and signs: Secondary | ICD-10-CM

## 2015-09-27 MED ORDER — OSELTAMIVIR PHOSPHATE 75 MG PO CAPS: 75.0000 mg | ORAL_CAPSULE | Freq: Two times a day (BID) | ORAL | Status: DC

## 2015-09-27 NOTE — Progress Notes (Signed)
   Subjective:    Patient ID: Monique Weber, female    DOB: 06-23-1955, 60 y.o.   MRN: [id number]  Chief Complaint  Patient presents with  . Generalized Body Aches    x 1 day  . Fever  . Headache  . Fatigue  . Nausea   Medications, allergies, past medical history, surgical history, family history, social history and problem list reviewed and updated.  HPI  60 yof presents with above complaints.   Symptoms started suddenly 2 mornings ago, approx 48 hrs ago. Sudden onset body aches, fatigue, joint pains, frontal headache, chills. Fever yest and today 101-102. Febrile today in clinic. Nausea but no emesis. Denies diarrhea, abd pain, head/nasal congestion, cough.   No known sick contacts. Did receive flu vaccine in October.   Denies ticks. Denies rash.   Has received tamiflu in the past and states that it worked well and did not have GI SEs. Would like tamiflu again today.   Review of Systems See HPI     Objective:   Physical Exam  Constitutional: She appears well-developed and well-nourished.  Non-toxic appearance. She does not have a sickly appearance. She does not appear ill. No distress.  BP 108/62 mmHg  Pulse 79  Temp(Src) 101.3 F (38.5 C) (Oral)  Resp 16  Ht 5\' 5"  (1.651 m)  Wt 188 lb (85.276 kg)  BMI 31.28 kg/m2  SpO2 96%  LMP 10/23/2012   HENT:  Right Ear: Tympanic membrane normal.  Left Ear: Tympanic membrane normal.  Nose: No mucosal edema or rhinorrhea. Right sinus exhibits no maxillary sinus tenderness and no frontal sinus tenderness. Left sinus exhibits no maxillary sinus tenderness and no frontal sinus tenderness.  Mouth/Throat: Uvula is midline, oropharynx is clear and moist and mucous membranes are normal.  Neck: No Brudzinski's sign noted.  Pulmonary/Chest: Effort normal and breath sounds normal. No tachypnea.  Abdominal: Soft. Normal appearance and bowel sounds are normal. There is no tenderness.  Lymphadenopathy:       Head (right side):  Submandibular adenopathy present. No submental and no tonsillar adenopathy present.       Head (left side): Submandibular adenopathy present. No submental and no tonsillar adenopathy present.    She has no cervical adenopathy.      Assessment & Plan:   Flu-like symptoms - Plan: oseltamivir (TAMIFLU) 75 MG capsule --symptoms sudden onset, pt would like tamiflu, will not test for flu as pt would like tamiflu regardless --no recent tick exposures or rash to suggest lyme, RMSF --rtc with if symptoms persistent or worsen   Julieta Gutting, PA-C Physician Assistant-Certified [facility]  09/27/2015 12:00 PM

## 2015-09-27 NOTE — Patient Instructions (Addendum)
Oseltamivir capsules What is this medicine? OSELTAMIVIR (os el TAM i vir) is an antiviral medicine. It is used to prevent and to treat some kinds of influenza or the flu. It will not work for colds or other viral infections. This medicine may be used for other purposes; ask your health care provider or pharmacist if you have questions. What should I tell my health care provider before I take this medicine? They need to know if you have any of the following conditions: -heart disease -immune system problems -kidney disease -liver disease -lung disease -an unusual or allergic reaction to oseltamivir, other medicines, foods, dyes, or preservatives -pregnant or trying to get pregnant -breast-feeding How should I use this medicine? Take this medicine by mouth with a glass of water. Follow the directions on the prescription label. Start this medicine at the first sign of flu symptoms. You can take it with or without food. If it upsets your stomach, take it with food. Take your medicine at regular intervals. Do not take your medicine more often than directed. Take all of your medicine as directed even if you think you are better. Do not skip doses or stop your medicine early. Talk to your pediatrician regarding the use of this medicine in children. While this drug may be prescribed for children as young as 14 days for selected conditions, precautions do apply. Overdosage: If you think you have taken too much of this medicine contact a poison control center or emergency room at once. NOTE: This medicine is only for you. Do not share this medicine with others. What if I miss a dose? If you miss a dose, take it as soon as you remember. If it is almost time for your next dose (within 2 hours), take only that dose. Do not take double or extra doses. What may interact with this medicine? Interactions are not expected. This list may not describe all possible interactions. Give your health care provider a  list of all the medicines, herbs, non-prescription drugs, or dietary supplements you use. Also tell them if you smoke, drink alcohol, or use illegal drugs. Some items may interact with your medicine. What should I watch for while using this medicine? Visit your doctor or health care professional for regular check ups. Tell your doctor if your symptoms do not start to get better or if they get worse. If you have the flu, you may be at an increased risk of developing seizures, confusion, or abnormal behavior. This occurs early in the illness, and more frequently in children and teens. These events are not common, but may result in accidental injury to the patient. Families and caregivers of patients should watch for signs of unusual behavior and contact a doctor or health care professional right away if the patient shows signs of unusual behavior. This medicine is not a substitute for the flu shot. Talk to your doctor each year about an annual flu shot. What side effects may I notice from receiving this medicine? Side effects that you should report to your doctor or health care professional as soon as possible: -allergic reactions like skin rash, itching or hives, swelling of the face, lips, or tongue -anxiety, confusion, unusual behavior -breathing problems -hallucination, loss of contact with reality -redness, blistering, peeling or loosening of the skin, including inside the mouth -seizures Side effects that usually do not require medical attention (report to your doctor or health care professional if they continue or are bothersome): -diarrhea -headache -nausea, vomiting -pain This list  may not describe all possible side effects. Call your doctor for medical advice about side effects. You may report side effects to FDA at 1-800-FDA-1088. Where should I keep my medicine? Keep out of the reach of children. Store at room temperature between 15 and 30 degrees C (59 and 86 degrees F). Throw away  any unused medicine after the expiration date. NOTE: This sheet is a summary. It may not cover all possible information. If you have questions about this medicine, talk to your doctor, pharmacist, or health care provider.    2016, [employer]. (2014-10-31 10:50:39)

## 2015-10-11 ENCOUNTER — Other Ambulatory Visit: Payer: Self-pay | Admitting: Pulmonary Disease

## 2015-11-09 ENCOUNTER — Other Ambulatory Visit: Payer: Self-pay | Admitting: Pulmonary Disease

## 2016-01-01 ENCOUNTER — Ambulatory Visit: Payer: BC Managed Care – PPO | Admitting: Pulmonary Disease

## 2016-01-07 ENCOUNTER — Ambulatory Visit: Payer: BC Managed Care – PPO | Admitting: Pulmonary Disease

## 2016-01-09 ENCOUNTER — Other Ambulatory Visit: Payer: Self-pay | Admitting: Pulmonary Disease

## 2016-01-14 ENCOUNTER — Telehealth: Payer: Self-pay | Admitting: Pulmonary Disease

## 2016-01-14 DIAGNOSIS — Z0000 Encounter for general adult medical examination without abnormal findings: Secondary | ICD-10-CM

## 2016-01-14 NOTE — Telephone Encounter (Signed)
Spoke with pt, requesting that we place lab orders for her to have drawn prior to office visit tomorrow morning.  SN please advise on what labs need to be ordered.  Thanks!

## 2016-01-14 NOTE — Telephone Encounter (Signed)
Labs have been placed in the computer for the pt and she is aware. Nothing further is needed.

## 2016-01-15 ENCOUNTER — Ambulatory Visit (INDEPENDENT_AMBULATORY_CARE_PROVIDER_SITE_OTHER): Payer: BC Managed Care – PPO | Admitting: Pulmonary Disease

## 2016-01-15 ENCOUNTER — Encounter: Payer: Self-pay | Admitting: Pulmonary Disease

## 2016-01-15 ENCOUNTER — Other Ambulatory Visit (INDEPENDENT_AMBULATORY_CARE_PROVIDER_SITE_OTHER): Payer: BC Managed Care – PPO

## 2016-01-15 VITALS — BP 126/72 | HR 74 | Temp 97.8°F | Ht 65.0 in | Wt 183.0 lb

## 2016-01-15 DIAGNOSIS — F411 Generalized anxiety disorder: Secondary | ICD-10-CM

## 2016-01-15 DIAGNOSIS — I10 Essential (primary) hypertension: Secondary | ICD-10-CM | POA: Diagnosis not present

## 2016-01-15 DIAGNOSIS — M150 Primary generalized (osteo)arthritis: Secondary | ICD-10-CM

## 2016-01-15 DIAGNOSIS — E7800 Pure hypercholesterolemia, unspecified: Secondary | ICD-10-CM | POA: Diagnosis not present

## 2016-01-15 DIAGNOSIS — Z0000 Encounter for general adult medical examination without abnormal findings: Secondary | ICD-10-CM

## 2016-01-15 DIAGNOSIS — K589 Irritable bowel syndrome without diarrhea: Secondary | ICD-10-CM

## 2016-01-15 DIAGNOSIS — M1990 Unspecified osteoarthritis, unspecified site: Secondary | ICD-10-CM | POA: Insufficient documentation

## 2016-01-15 DIAGNOSIS — J4530 Mild persistent asthma, uncomplicated: Secondary | ICD-10-CM

## 2016-01-15 DIAGNOSIS — M159 Polyosteoarthritis, unspecified: Secondary | ICD-10-CM

## 2016-01-15 DIAGNOSIS — K219 Gastro-esophageal reflux disease without esophagitis: Secondary | ICD-10-CM

## 2016-01-15 DIAGNOSIS — E663 Overweight: Secondary | ICD-10-CM

## 2016-01-15 LAB — CBC WITH DIFFERENTIAL/PLATELET
BASOS PCT: 0.8 % (ref 0.0–3.0)
Basophils Absolute: 0.1 10*3/uL (ref 0.0–0.1)
EOS PCT: 6 % — AB (ref 0.0–5.0)
Eosinophils Absolute: 0.4 10*3/uL (ref 0.0–0.7)
HEMATOCRIT: 35 % — AB (ref 36.0–46.0)
Hemoglobin: 12.1 g/dL (ref 12.0–15.0)
LYMPHS ABS: 1.6 10*3/uL (ref 0.7–4.0)
LYMPHS PCT: 23.6 % (ref 12.0–46.0)
MCHC: 34.7 g/dL (ref 30.0–36.0)
MCV: 88.2 fl (ref 78.0–100.0)
MONOS PCT: 6.6 % (ref 3.0–12.0)
Monocytes Absolute: 0.4 10*3/uL (ref 0.1–1.0)
NEUTROS ABS: 4.2 10*3/uL (ref 1.4–7.7)
NEUTROS PCT: 63 % (ref 43.0–77.0)
PLATELETS: 361 10*3/uL (ref 150.0–400.0)
RBC: 3.97 Mil/uL (ref 3.87–5.11)
RDW: 15.1 % (ref 11.5–15.5)
WBC: 6.7 10*3/uL (ref 4.0–10.5)

## 2016-01-15 LAB — COMPREHENSIVE METABOLIC PANEL
ALK PHOS: 62 U/L (ref 39–117)
ALT: 27 U/L (ref 0–35)
AST: 25 U/L (ref 0–37)
Albumin: 4.2 g/dL (ref 3.5–5.2)
BUN: 17 mg/dL (ref 6–23)
CALCIUM: 9.6 mg/dL (ref 8.4–10.5)
CO2: 30 meq/L (ref 19–32)
Chloride: 105 mEq/L (ref 96–112)
Creatinine, Ser: 0.93 mg/dL (ref 0.40–1.20)
GFR: 65.16 mL/min (ref >60.00)
GLUCOSE: 132 mg/dL — AB (ref 70–99)
POTASSIUM: 4 meq/L (ref 3.5–5.1)
Sodium: 141 mEq/L (ref 135–145)
Total Bilirubin: 0.4 mg/dL (ref 0.2–1.2)
Total Protein: 7.2 g/dL (ref 6.0–8.3)

## 2016-01-15 LAB — LIPID PANEL
CHOL/HDL RATIO: 4
Cholesterol: 237 mg/dL — ABNORMAL HIGH (ref 0–200)
HDL: 65.2 mg/dL (ref >39.00)
LDL Cholesterol: 153 mg/dL — ABNORMAL HIGH (ref 0–99)
NONHDL: 171.31
Triglycerides: 92 mg/dL (ref 0.0–149.0)
VLDL: 18.4 mg/dL (ref 0.0–40.0)

## 2016-01-15 NOTE — Progress Notes (Signed)
Subjective:    Patient ID: Monique Weber, female    DOB: Apr 24, 1956, 60 y.o.   MRN: [id number]  HPI 60 y/o WF here for a follow up visit... she has multiple medical problems as noted below...   ~  SEE PREV EPIC NOTES FOR OLDER DATA >>    Last CXR 11/10 showed borderline cardiomeg, clear lungs, irregularity at post part of left hemidiaph w/ sm defect & herniation of intra-abd fat (see 6/07 CTscan).  EKG 3/12 showed NSR, rate75, axis within normal limits, intervals within normal limits, no acute ST-T wave changes...  Nuclear Stress Test 3/12 was neg- no ischemia or infarction, EF=70%...  LABS 4/13:  FLP- at goals;  Chems- wnl;  CBC- wnl;  TSH=1.57...   CXR 7/14 showed normal heart size, clear lungs, DJD Tspine, NAD...  LABS 7/14:  FLP- ok on Lip80;  Chems- wnl;  CBC- wnl;  TSH=1.77;  VitD=50;  ACE=18... ~  December 21, 2013:  She had a total Vaginal Hysterectomy 6/15 from DrNeal [facility] available) and now fully recovered & feeling better...  Also had f/u w/ Ophthalmology, DrMcCuen w/ hx glaucoma (s/p surg) and cataracts (L>R, pt states needs surg soon).   LABS 7/15 in EPIC>  FLP- not at goals w/ TG=178;  Chems- wnl;  CBC- wnl...   ~  December 20, 2014:  [place]/ left eye cataract surg 11/26/14 (DrMcCuen) & right eye sched for next week; [facility]... Her CC is left knee pain- "it gives out on me all the time" eval & rx by DrACollins- said to have DJD, torn meniscus, torn ACL & getting cortisone shots, PT, & later may need TKR... She reports breathing is OK but the ragweed is tough- using Advair & Proair as needed... We reviewed the following medical problems during today's office visit >> She uses [facility] for some primary care needs...     Asthma> no problems over the last yr; she uses the Advair250Bid seasonally & reports that Autumn is the worst due to ragweed etc; also has ProairHFA for prn use...     HBP> on Labet200-2Bid, Diltiaz360/d, Losartan100, Lasix20/d; BP= 116/64 & she denies CP, palpit, SOB, edema; she knows to avoid caffeine etc w/ her hx of SVT.    Cardiac> seen by DrHochrein 2012 w/ AtypCP & HBP; she is upset about Myoview that cost her $2500 out of pocket & was neg.    Chol> on Lip80 + diet; FLP 7/15 showed TChol 214, TG 178, HDL 86, LDL 92... Reviewed low fat diet & discussed wt reduction strategies.    Overweight> weight is stable at 192# but she is no longer "juicing"; we reviewed diet, exercise, & wt reduction program...    GI> Hx GERD & IBS> she reports stable; she is off her Prilosec20 but can use it prn symptoms; her last colon was 2007 & neg- f/u 156yr    GYN> followed by DrElmon Elsen Premarin; [place] [facility] [place]; Gyn does her mammography & BMD.    Ortho> hx bilat shoulder pain, neck pain, knee pain, foot pain; she reports prev Chiropractor Rx & uses OTC Advil prn; sees DrACollins for Ortho w/ knee shots that helped; she stretches, getting PT, and uses knee brace prn.     DERM> there is a pos FamHx of melanoma & she still sees Derm regularly for  checks...    Anxiety> followed by DrMcKinney (now retired) on [place] prn; she also sees Runner, broadcasting/film/video for counseling...    Ophthalmology> ?detached vitreous, glaucoma w/ surg, & cats=> left eye cataract surg 11/26/14 (DrMcCuen) & right eye sched soon. We reviewed prob list, meds, xrays and labs>>   EKG> Jenny Reichmann says she had one [facility] before her Hyst 10/2013 & it was OK (we do not have copy)...  CXR 12/20/14 showed borderline cardiomeg, clear lungs w/ mild bibasilar atx/scarring, NAD, DJD Tspine...   PFT 12/20/14 showed FVC=2.30 (66%), FEV1=1.76 (64%), %1sec=77, mid-flows are reduced at 59% predicted...  LABS> she will ret for fasting blood work.  ~  July 26, 2015:  23moROV & Cindy called recently reporting mother passed away, incr stress, and BP up to  190/100 on her 4 meds; we added low dose Hyralazine 265mid & she is improved- BP= 132/78 today & feeling well- HAs resolved and no CP, palpit, dizzy, SOB, edema... Her daugh is a nuMarine scientistorking [facility] cardiac [facility] CiJenny Reichmanns into alternative therapies and is seeing DrDalton-Bethea, pain specialist, for PRP treatments for her knee arthritis- platelet rich plasma injections into her knee (right improved and left pending)... She uses [facility] for primary needs.    Asthma> no problems over the last yr; she uses the Advair250Bid seasonally & reports that Autumn is the worst due to ragweed etc; also has ProairHFA for prn use...    HBP> on Labet200-2Bid, Diltiaz360/d, Losartan100, Lasix20/d & Apres25Bid now; BP= 132.78 & she denies CP, palpit, SOB, edema; she knows to avoid caffeine etc w/ her hx of SVT.    Cardiac> seen by DrHochrein 2012 w/ AtypCP & HBP (she was upset about Myoview that cost her $2500 out of pocket & was neg).    Chol> on Lip80 + diet; FLP 8/16 showed TChol 232, TG 102, HDL 81, LDL 131... Needs better medication compliance & discussed wt reduction strategies.    Overweight> weight is stable at 193# but she is no longer "juicing"; we reviewed diet, exercise, & wt reduction program... EXAM shows Afeb, VSS, O2sat=98% on RA;  HEENT- neg, mallampati1;  Chest- clear w/o w/r/r;  Heart- RR w/o m/r/g;  Abd- soft, neg;  Ext- w/o c/c/e;  Neuro- intact...  IMP/PLAN>>  BP improved w/ the addition of Apresoline- continue all 5 meds & monitor BP at home...  ~  January 15, 2016:  7m37moVWellingtonports that she has retired w/ pension ([place] [employer]/ [employer]/ etc) [place] the [place] 3 cats/ 2 dogs, 2 coy ponds!  Her CC is DJD, LBP, and the move didn't help;  She tells me that she stopped her Premarin because she read it can cause HBP & since then her BP has been better but the trade-off includes hot flashes (she wll discuss w/ GYN-DrNeal but I  have rec [place] trial), and snce her BP is improved off the Premarin she also wants to stop the diltiazem rx... We reviewed the following medical problems during today's office visit >>     Asthma> she uses the Advair250Bid seasonally & reports that Autumn is the worst due to ragweed etc; also has ProairHFA for prn use...    HBP> on Labet200-2Bid, Diltiaz360/d, Losartan100, Lasix20/d & Apres25Bid; BP= 126/74 & she denies CP, palpit, SOB, edema; she knows to avoid caffeine etc w/ her hx of  SVT; she stopped Premarin after reading that it can cause incr BP & says it's improved, she wants to stop the Diltiazem & see if BP stays good (she will chec & call for problems)...    Cardiac> seen by DrHochrein 2012 w/ AtypCP & HBP (she was upset about Myoview that cost her $2500 out of pocket & was neg); no prob now w/o CP, palpit, SOB, edema, etc...    Chol> on diet, Lip80 + CoQ10; FLP 9/17 showed TChol 237, TG 92, HDL 65, LDL 153; says she's been taking it everyday- options are to consider change to Crestor40 vs [facility] for Repatha shots, she chose Cres40 trial...    Overweight> weight is down 10# to 183# but she is no longer "juicing"; we reviewed diet, exercise, & wt reduction program...    GI> Hx GERD & IBS> she reports stable; she is off her Prilosec20 but can use it prn symptoms; her last colon was 2007 & neg- f/u 55yr due...    GYN> followed by DElmon Else pt stopped Premarin on her own; [facility]; Gyn does her mammography & BMD.    Ortho> hx bilat shoulder pain, neck pain, knee pain, foot pain; she reports prev Chiropractor Rx & uses OTC Advil/ Glucosamine/ supplements; sees DrACollins for Ortho w/ knee shots that helped; she stretches, getting PT, and uses knee brace prn.     DERM> there is a pos FamHx of melanoma & she still sees Derm regularly for checks...    Anxiety> followed by LDebbora DusNP (DrMcKinney now retired) on ZComstockprn; she also sees MRunner, broadcasting/film/videofor counseling...    Ophthalmology> ?detached vitreous, glaucoma w/ surg, & cats=> left eye cataract surg 11/26/14 (DrMcCuen) & right eye sched soon. EXAM shows Afeb, VSS, O2sat=99% on RA;  HEENT- neg, mallampati1;  Chest- clear w/o w/r/r;  Heart- RR w/o m/r/g;  Abd- soft, neg;  Ext- w/o c/c/e;  Neuro- intact...   LABS 01/15/16>  FLP- not at goals on Lip80;  Chems- ok x BS=132;  CBC- ok w/ Hg=12.1;  TSH=1.27;  VitD=33;  HepC= NEG... IMP/PLAN>>  CJenny Reichmannis stable & feeling more relaxed now that she is retired; she is hoping that this will lead to a drop in her BP & esp since she read that Premarin can RAISE the BP (so she stopped the hormone therapy on her own) & she is wanting to stop the Diltiazem & monitor her BP at home; we reviewed the imperative to lose wt & elim sodium too; she will watch her BP & call for problems;  The other issue is her FLP- not at goals on max Lip80 + diet; she was given options of adding Zetia10, trial switch to Cres40, [facility] (she is leaning towards the Cres40 option)- plan ROV in [place] this & FLP...           Problem List:  ASTHMA (ICD-493.90) - on ADVAIR 250 Bid & PROAIR HFA Prn...  ~  prev on Accolate but was only taking it 1-2 per week therefore discontinued... hx asthmatic bronchitis w/o exac x yrs although incr difficulty in the spring... ~  CXR 11/10 showed borderline cardiomeg, clear lungs, sm left diaph defect... ~  CXR 7/14 showed normal heart size, clear lungs, DJD Tspine, NAD...Marland Kitchen~  CXR 8/16 showed borderline cardiomeg, clear lungs w/ mild bibasilar atx/scarring, NAD, DJD Tspine...  ~  PFT 12/20/14  showed FVC=2.30 (66%), FEV1=1.76 (64%), %1sec=77, mid-flows are reduced at 59% predicted... ~  currently doing well- denies cough, sputum, hemoptysis, worsening dyspnea,  wheezing, chest pains, snoring, daytime hypersomnolence, etc...    HYPERTENSION (ICD-401.9) >>  ~  baseline EKG shows NSR,  WNL... ~  2DEcho 11/06 was normal- EF= 55-65% & no regional wall motion abn. ~  She notes INTOL to Lisinopril stating it caused her to fall, balance prob, dizzy & anxious; feels much better off this med. ~  3/12:  She had treadmill exercise test showing poor exercise tolerance & was non-diagnostic; then Myoview==> neg w/o ischemia & EF=70% ~  5/12: BP = 136/78 & similar at home... denies HA, fatigue, visual changes, CP, palipit, dizziness, syncope, dyspnea, edema, etc...  ~  3/13: BP= 150/90 & we decided to add LOSARTAN 135m/d as a trial... ~  7/14: on Labet200-2Bid, Diltiaz360/d, Losartan100, Lasix20/d; BP= 140/80 & she denies CP, palpit, SOB, edema; she knows to avoid caffeine etc w/ her hx of SVT. ~  8/15: on Labet200-2Bid, Diltiaz360/d, Losartan100, Lasix20/d; BP= 130/80 & she remains asymptomatic... ~  8/16: on Labet200-2Bid, Diltiaz360/d, Losartan100, Lasix20/d; BP= 116/64 & she denies CP, palpit, SOB, edema; she knows to avoid caffeine etc w/ her hx of SVT. ~  3/17: on Labet200-2Bid, Diltiaz360/d, Losartan100, Lasix20/d & Apres25Bid now; BP= 132/78 & HAs resolved & she denies CP, palpit, SOB, edema...  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 no recent CP, palpit, etc...  HYPERCHOLESTEROLEMIA (ICD-272.0) - on SIMVASTATIN 461mhs + FishOil...  ~  [place]/08 on Vytor10/40 showed TChol 264, TG 93, HDL 69, LDL 187... change to Simva80. ~  FLP 12/08 on Simva80 showed TChol 208, TG 78, HDL 61, LDL 139... continue med, better diet. ~  FLP 7/10 off med showed TChol 260, TG 85, HDL 96, LDL 156... rec> restart Simva80. ~  [place]/10 on Simva80 showed TChol 208, TG 89, HDL 80, LDL 130... she decr to Simva40 per Insurance Co. ~  FLP 5/11 on Simva40 showed TChol 227, TG 119, HDL 84, LDL 138 ~  FLP 11/11 on Simva40 showed TChol 233, TG 128, HDL 88, LDL 136... rec> ch to LIP40, later incr to LIP80 by Cards. ~  FLP 5/12 on Lip80 showed TChol 206, TG  109, HDL 80, LDL 113 ~  FLP 3/13 on Lip80 showed TChol 191, TG 109, HDL 80, LDL 89 ~  FLP 7/14 on Lip80 showed TChol 210, TG 117, HDL 88, LDL 111 ~  FLP 7/15 on Lip80 showed TChol 214, TG 178, HDL 86, LDL 92... Reminded to follow low fat diet & get wt down. ~  FLP 8/16 on Lip80 => pending  OVERWEIGHT (ICD-278.02) - we reviewed diet + exercise program necessary to lose weight. ~  weight 8/08 = 185# ~  weight 7/10 = 201# ~  weight 11/10 = 185# ~  weight 5/11 = 187# ~  weight 11/11 = 191# ~  Weight 5/12 = 198# ~  Weight 3/13 = 194# ~  Weight 7/14 = 195# ~  Weight 7/15 = 192# ~  Weight 8/16 = 192#  Hx of GERD (ICD-530.81) - uses PRILOSEC OTC 2057mrn.  IRRITABLE BOWEL SYNDROME (ICD-564.1) - prev [facility] ~  colonoscopy 5/07 by DrJohnson was WNL... Marland Kitchenhe saw DrNat-Mann after that for post-colonoscopy pain & dx w/ IBS...  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  ~  she sees chiropractor Prn w/ good relief of symptoms....  ~  s/p bilat stress fractures of the feet. ~  8/15: she reports eval by DrACollins, [facility] (states ACL and cartilage tears) w/ shots in left knee which helped... ~  8/16: her CC is her left knee- sees DrACollins for Ortho w/ knee shots that helped; has DJD, torn meniscus, torn ACL & getting shots, PT, & may need TKR- she stretches, getting PT, and uses knee brace prn.   ANXIETY (ICD-300.00) & DEPRESSION (ICD-311) - on ALPRAZOLAM 0.12m- takng 2Qhs, ZOLOFT 1012md, & RESTORIL 3070ms prn... eval by DrMcKinney for Psychiatry & prev counselling as well...  Family Hx of MELANOMA (ICD-172.9) - both parents have hx malig melanoma removed- pt sees Derm Q6mo61mo Health Maintenance:  GYN prev DrCollins, now DrNeal- on Premarin 0.625/ Prometrium (x12d every 3 mo)... also  takes Calcium/ MVI/ Vit D 2000 u daily... [facility]... ~  [facility]  [facility]... ~  GYN:  Followed by DrNeElmon Elsetotal vag hyst done 6/15; she is up to date on mammograms and they do her BMD as well (we do not have records from them)... ~  Immuniz:  She gets the yearly Flu vaccines;  She had TDAP 2013;  She was given PREVNAR-13 8/15;  We discussed shingles vaccine...   Past Surgical History:  Procedure Laterality Date  . ABDOMINAL HYSTERECTOMY  10/09/2013   June 2015;post-menopausal bleeding; ovaries resected.  . CERVICAL POLYPECTOMY  2005  . [place] 1980  . EYE SURGERY     Cataracts B.    Outpatient Encounter Prescriptions as of 01/15/2016  Medication Sig Dispense Refill  . ADVAIR DISKUS 250-50 MCG/DOSE AEPB INHALE 1 PUFF BY MOUTH TWICE DAILY. RINSE MOUTH AFTER USE 60 each 5  . albuterol (PROAIR HFA) 108 (90 Base) MCG/ACT inhaler Inhale 2 puffs into the lungs every 6 (six) hours as needed for wheezing. 3 Inhaler 3  . ALPRAZolam (XANAX) 0.5 MG tablet Take 1 1/2 tablet by mouth at bedtime    . atorvastatin (LIPITOR) 80 MG tablet TAKE 1 TABLET BY MOUTH EVERY DAY 90 tablet 3  . b complex vitamins tablet Take 1 tablet by mouth daily.    . Calcium Carbonate-Vit D-Min (CALCIUM 1200 PO) Take 1 tablet by mouth daily.    . Cholecalciferol (VITAMIN D3) 2000 UNITS TABS Take 1 tablet by mouth daily.    . coMarland Kitchenenzyme Q-10 30 MG capsule Take 30 mg by mouth daily.    . Cranberry Extract 200 MG CAPS Take 1 capsule by mouth daily.    . cyclobenzaprine (FLEXERIL) 5 MG tablet TAKE 1 TABLET BY MOUTH THREE TIMES DAILY AS NEEDED FOR MUSCLE SPASMS 60 tablet 3  . furosemide (LASIX) 20 MG tablet TAKE 1 TABLET BY MOUTH EVERY DAY 30 tablet 0  . glucosamine-chondroitin 500-400 MG tablet Take 1 tablet by mouth daily.     . hydrALAZINE (APRESOLINE) 25 MG tablet TAKE 1 TABLET BY MOUTH TWICE DAILY 60  tablet 5  . ibuprofen (ADVIL,MOTRIN) 200 MG tablet Take 200 mg by mouth 2 (two) times daily as needed.    . labetalol (NORMODYNE) 200 MG tablet TAKE TWO TABLETS BY MOUTH TWICE DAILY 360 tablet 1  . losartan (COZAAR) 100  MG tablet TAKE 1 TABLET EVERY DAY 90 tablet 0  . magnesium 30 MG tablet Take 30 mg by mouth daily.    . Multiple Vitamins-Minerals (MULTIVITAMIN WITH MINERALS) tablet Take 1 tablet by mouth daily.      Marland Kitchen oseltamivir (TAMIFLU) 75 MG capsule Take 1 capsule (75 mg total) by mouth 2 (two) times daily. 10 capsule 0  . sertraline (ZOLOFT) 100 MG tablet Take 100 mg by mouth daily.     . temazepam (RESTORIL) 30 MG capsule Take 30 mg by mouth at bedtime as needed.      . [DISCONTINUED] atorvastatin (LIPITOR) 80 MG tablet TAKE 1 TABLET BY MOUTH EVERY DAY 90 tablet 3  . [DISCONTINUED] diltiazem (TIAZAC) 360 MG 24 hr capsule TAKE 1 TABLET BY MOUTH DAILY (Patient not taking: Reported on 01/15/2016) 30 capsule 3  . [DISCONTINUED] estrogens, conjugated, (PREMARIN) 0.625 MG tablet Take 0.625 mg by mouth daily. Take daily for 21 days then do not take for 7 days.      No facility-administered encounter medications on file as of 01/15/2016.     No Known Allergies   Current Medications, Allergies, Past Medical History, Past Surgical History, Family History, and Social History were reviewed in [employer] record.  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 Margretta Sidle re: energy therapy, shakra points, meditation, etc...   Review of Systems       See HPI - all other systems neg except as noted... The patient denies anorexia, fever, weight loss, weight gain, vision loss, decreased hearing, hoarseness, chest pain, syncope, dyspnea on exertion, peripheral edema, prolonged cough, headaches, hemoptysis, abdominal pain, melena, hematochezia, severe indigestion/heartburn, hematuria, incontinence, muscle weakness, suspicious skin lesions,  transient blindness, difficulty walking, depression, unusual weight change, abnormal bleeding, enlarged lymph nodes, and angioedema.     Objective:   Physical Exam      WD, Overweight, 60 y/o WF in NAD... Vital signs:  Reviewed... GENERAL:  Alert & oriented; pleasant & cooperative. HEENT:  [place]/AT, EOM-wnl, PERRLA, Fundi-benign, EACs-clear, TMs-wnl, NOSE-clear, THROAT-clear & wnl. NECK:  Supple w/ full ROM; no JVD; normal carotid impulses w/o bruits; no thyromegaly or nodules palpated; no lymphadenopathy. CHEST:  Clear to P & A; without wheezes/ rales/ or rhonchi. HEART:  Regular Rhythm; without murmurs/ rubs/ or gallops. ABDOMEN:  Soft & nontender; normal bowel sounds; no organomegaly or masses detected. EXT: without deformities or arthritic changes; no varicose veins/ venous insuffic/ or edema. NEURO:  CN's intact; motor testing normal; sensory testing normal; gait normal & balance OK. DERM:  No lesions noted; no rash etc...  RADIOLOGY DATA:  Reviewed in the EPIC EMR & discussed w/ the patient...  LABORATORY DATA:  Reviewed in the EPIC EMR & discussed w/ the patient...   Assessment & Plan:   CPX>>  ASTHMA>  Stable w/o exac, we prefer Rx w/ Advair regularly but she prefers seasonally only,  & Proair rescue as needed...  HBP>  BP seems better controlled on her 5 med regimen- continue same => see above (she has stopped her Premarin & now wants to stop the Diltiazem)  CHOL>  still not at goal on Lip80> needs better low fat diet & get wt down, & offered 3 options: add Zetia10, switch to Cres40, refer to [facility] for Repatha (she chose Cres40)  Overwt>  Wt reduction is the key to improving the other parameters...  Anxiety, Depression>  She continues f/u w/ DrMcKinney & MBaker.Marland KitchenMarland Kitchen  Patient's Medications  New Prescriptions                                                              CRESTOR 22m -- one tab daily...   This will replace her Lip80  Previous Medications   ADVAIR DISKUS 250-50  MCG/DOSE AEPB    INHALE 1 PUFF BY MOUTH TWICE DAILY. RINSE MOUTH AFTER USE   ALBUTEROL (PROAIR HFA) 108 (90 BASE) MCG/ACT INHALER    Inhale 2 puffs into the lungs every 6 (six) hours as needed for wheezing.   ALPRAZOLAM (XANAX) 0.5 MG TABLET    Take 1 1/2 tablet by mouth at bedtime   ATORVASTATIN (LIPITOR) 80 MG TABLET                    SHE WILL STOP THIS MED   B COMPLEX VITAMINS TABLET    Take 1 tablet by mouth daily.   CALCIUM CARBONATE-VIT D-MIN (CALCIUM 1200 PO)    Take 1 tablet by mouth daily.   CHOLECALCIFEROL (VITAMIN D3) 2000 UNITS TABS    Take 1 tablet by mouth daily.   CO-ENZYME Q-10 30 MG CAPSULE    Take 30 mg by mouth daily.   CRANBERRY EXTRACT 200 MG CAPS    Take 1 capsule by mouth daily.   CYCLOBENZAPRINE (FLEXERIL) 5 MG TABLET    TAKE 1 TABLET BY MOUTH THREE TIMES DAILY AS NEEDED FOR MUSCLE SPASMS   FUROSEMIDE (LASIX) 20 MG TABLET    TAKE 1 TABLET BY MOUTH EVERY DAY   GLUCOSAMINE-CHONDROITIN 500-400 MG TABLET    Take 1 tablet by mouth daily.    HYDRALAZINE (APRESOLINE) 25 MG TABLET    TAKE 1 TABLET BY MOUTH TWICE DAILY   IBUPROFEN (ADVIL,MOTRIN) 200 MG TABLET    Take 200 mg by mouth 2 (two) times daily as needed.   LABETALOL (NORMODYNE) 200 MG TABLET    TAKE TWO TABLETS BY MOUTH TWICE DAILY   LOSARTAN (COZAAR) 100 MG TABLET    TAKE 1 TABLET EVERY DAY   MAGNESIUM 30 MG TABLET    Take 30 mg by mouth daily.   MULTIPLE VITAMINS-MINERALS (MULTIVITAMIN WITH MINERALS) TABLET    Take 1 tablet by mouth daily.     OSELTAMIVIR (TAMIFLU) 75 MG CAPSULE    Take 1 capsule (75 mg total) by mouth 2 (two) times daily.   SERTRALINE (ZOLOFT) 100 MG TABLET    Take 100 mg by mouth daily.    TEMAZEPAM (RESTORIL) 30 MG CAPSULE    Take 30 mg by mouth at bedtime as needed.    Modified Medications   No medications on file  Discontinued Medications   ATORVASTATIN (LIPITOR) 80 MG TABLET    TAKE 1 TABLET BY MOUTH EVERY DAY   DILTIAZEM (TIAZAC) 360 MG 24 HR CAPSULE                SHE REQUESTS OFF THIS MED    ESTROGENS, CONJUGATED, (PREMARIN) 0.625 MG TABLET    Take 0.625 mg by mouth daily. Take daily for 21 days then do not take for 7 days.

## 2016-01-15 NOTE — Patient Instructions (Signed)
Today we updated your med list in our EPIC system...    Continue your current medications the same...  Congrats on your move to [facility]!!!    Relax & enjoy!  We decided to STOP the Tiazac (Diltiazem)...    Continue to monitor your BP & call for any problems...  Let's plan a follow up visit in 91mo, sooner if needed for any problems.Marland KitchenMarland Kitchen

## 2016-01-16 LAB — HEPATITIS C ANTIBODY: HCV Ab: NEGATIVE

## 2016-01-16 LAB — VITAMIN D 25 HYDROXY (VIT D DEFICIENCY, FRACTURES): VITD: 33.13 ng/mL (ref 30.00–100.00)

## 2016-01-16 LAB — TSH: TSH: 1.27 u[IU]/mL (ref 0.35–4.50)

## 2016-01-17 ENCOUNTER — Telehealth: Payer: Self-pay | Admitting: Pulmonary Disease

## 2016-01-17 NOTE — Telephone Encounter (Signed)
Monique Space, MD  Elie Confer, CMA        Please notify patient>   FLP on Lip80 is still not at goals w/ TChol=237 (goal<200) & LDL=153 (goal<100)...  She is on Lip80 (top dose, may not be strong enough for her) & she may need trial of Crestor40 (the most potent oral med, or consideration of one of the new injectables)...  Chems- ok x FBS=132 in mild diabetic range therefore needs low carb wt reducing diet & incr exercise w/ wt reduction OR start DM meds now- I need her to decide...  CBC, Thyroid, VitD are all OK/ wnl, and Hep-C test is NEG...  REC> Does she want to switch to Crestor40? Consider Repatha injectable? In either case she needs better low chol/ low fat/ & LOW CARB diet w/ weight reduction...    LMTCB x1

## 2016-01-20 MED ORDER — ROSUVASTATIN CALCIUM 40 MG PO TABS: 40.0000 mg | ORAL_TABLET | Freq: Every day | ORAL | 11 refills | Status: DC

## 2016-01-20 NOTE — Telephone Encounter (Signed)
Spoke with pt. She is aware of results. Pt would like to go ahead and change to Crestor 40. At this time she does not want to start DM medications. Rx for Crestor has been sent in. Nothing further was needed.

## 2016-01-24 ENCOUNTER — Encounter: Payer: Self-pay | Admitting: Gastroenterology

## 2016-01-28 ENCOUNTER — Telehealth: Payer: Self-pay | Admitting: Pulmonary Disease

## 2016-01-28 NOTE — Telephone Encounter (Signed)
Pt states that she feels that she is having a severe reaction to Crestor 40mg .  Pt c/o chills, night sweats, headaches, joint/muscle pain, numbness in hands and feet and nausea.  Denies vomiting. Pt notes that the symptoms started with controllable emotions, itching and severe fatigue.  Pt states that researched the medication on [facility] online and these were some common side effects.  Pt is wanting to be switched to something else or cut dose down to see if she tolerates differently.  Pt states that she is okay with going back to the Zocor if that's what Dr Lenna Gilford is wanting to do.  Please advise Dr Lenna Gilford. Thanks.     Medication List       Accurate as of 01/28/16 11:34 AM. Always use your most recent med list.          ADVAIR DISKUS 250-50 MCG/DOSE Aepb Generic drug:  Fluticasone-Salmeterol INHALE 1 PUFF BY MOUTH TWICE DAILY. RINSE MOUTH AFTER USE   albuterol 108 (90 Base) MCG/ACT inhaler Commonly known as:  PROAIR HFA Inhale 2 puffs into the lungs every 6 (six) hours as needed for wheezing.   ALPRAZolam 0.5 MG tablet Commonly known as:  XANAX Take 1 1/2 tablet by mouth at bedtime   atorvastatin 80 MG tablet Commonly known as:  LIPITOR TAKE 1 TABLET BY MOUTH EVERY DAY   b complex vitamins tablet Take 1 tablet by mouth daily.   CALCIUM 1200 PO Take 1 tablet by mouth daily.   co-enzyme Q-10 30 MG capsule Take 30 mg by mouth daily.   Cranberry Extract 200 MG Caps Take 1 capsule by mouth daily.   cyclobenzaprine 5 MG tablet Commonly known as:  FLEXERIL TAKE 1 TABLET BY MOUTH THREE TIMES DAILY AS NEEDED FOR MUSCLE SPASMS   furosemide 20 MG tablet Commonly known as:  LASIX TAKE 1 TABLET BY MOUTH EVERY DAY   glucosamine-chondroitin 500-400 MG tablet Take 1 tablet by mouth daily.   hydrALAZINE 25 MG tablet Commonly known as:  APRESOLINE TAKE 1 TABLET BY MOUTH TWICE DAILY   ibuprofen 200 MG tablet Commonly known as:  ADVIL,MOTRIN Take 200 mg by mouth 2 (two)  times daily as needed.   labetalol 200 MG tablet Commonly known as:  NORMODYNE TAKE TWO TABLETS BY MOUTH TWICE DAILY   losartan 100 MG tablet Commonly known as:  COZAAR TAKE 1 TABLET EVERY DAY   magnesium 30 MG tablet Take 30 mg by mouth daily.   multivitamin with minerals tablet Take 1 tablet by mouth daily.   oseltamivir 75 MG capsule Commonly known as:  TAMIFLU Take 1 capsule (75 mg total) by mouth 2 (two) times daily.   rosuvastatin 40 MG tablet Commonly known as:  CRESTOR Take 1 tablet (40 mg total) by mouth daily.   sertraline 100 MG tablet Commonly known as:  ZOLOFT Take 100 mg by mouth daily.   temazepam 30 MG capsule Commonly known as:  RESTORIL Take 30 mg by mouth at bedtime as needed.   Vitamin D3 2000 units Tabs Take 1 tablet by mouth daily.      No Known Allergies

## 2016-01-28 NOTE — Telephone Encounter (Signed)
Per SN---  No other option but to stop the crestor.  Go through the washout period for symptoms to resolve, then call us back and we will refer her to the lipid clinic.  Pt is aware and she will call back.  crestor added to her allergy list and taken off her med list.

## 2016-02-06 ENCOUNTER — Telehealth: Payer: Self-pay | Admitting: Pulmonary Disease

## 2016-02-06 NOTE — Telephone Encounter (Signed)
Per SN: The side effects most seen with Statin drugs are aches, soreness and elevated LFT's. Some of her side effects are not consistent with this.  Pt had tolerated Lipitor 80 without difficulty but was changed to crestor due to elevated lab values. Verify that pt is not taking any statins at this time.  I recommend increase rest, fluids and Tylenol as needed and ov with me to re evaluate.  Spoke with pt and advised of Dr Jeannine Kitten recommendations.  Pt states she is not taking any statins at this time.  She lives 2 hours away and doesn't think she can come for appt tomorrow.  She wants to try Dr Jeannine Kitten recommendations and call us on Monday if no better.  Pt advised if symptoms worsen to got to nearest ER to be evaluated.  Pt verbalized understanding.

## 2016-02-06 NOTE — Telephone Encounter (Signed)
Called spoke with pt. Pt reports she is still having side effects from the crestor. Reports she stopped this 2 weeks ago. C/o chills, night sweats, chest pain off/on, numbness/tingling in both arms/feet, muscle pain, joint pain, crying often, losing weight, feels nauseated. Only able to eat about twice a day. Has loss of appetite. Denies any dark urine.  Pt reports she also stopped diltiazem. BP was 189/96 yesterday, today it was 157/99. She feels her heart rate is "faster than normal".  Please advise Dr. Lenna Gilford thanks  Allergies  Allergen Reactions  . Crestor [Rosuvastatin Calcium] Nausea Only    Chills,night sweats, headaches, joint/muscle pain, numbness in hands, nausea, weight loss     Current Outpatient Prescriptions on File Prior to Visit  Medication Sig Dispense Refill  . ADVAIR DISKUS 250-50 MCG/DOSE AEPB INHALE 1 PUFF BY MOUTH TWICE DAILY. RINSE MOUTH AFTER USE 60 each 5  . albuterol (PROAIR HFA) 108 (90 Base) MCG/ACT inhaler Inhale 2 puffs into the lungs every 6 (six) hours as needed for wheezing. 3 Inhaler 3  . ALPRAZolam (XANAX) 0.5 MG tablet Take 1 1/2 tablet by mouth at bedtime    . atorvastatin (LIPITOR) 80 MG tablet TAKE 1 TABLET BY MOUTH EVERY DAY 90 tablet 3  . b complex vitamins tablet Take 1 tablet by mouth daily.    . Calcium Carbonate-Vit D-Min (CALCIUM 1200 PO) Take 1 tablet by mouth daily.    . Cholecalciferol (VITAMIN D3) 2000 UNITS TABS Take 1 tablet by mouth daily.    Marland Kitchen co-enzyme Q-10 30 MG capsule Take 30 mg by mouth daily.    . Cranberry Extract 200 MG CAPS Take 1 capsule by mouth daily.    . cyclobenzaprine (FLEXERIL) 5 MG tablet TAKE 1 TABLET BY MOUTH THREE TIMES DAILY AS NEEDED FOR MUSCLE SPASMS 60 tablet 3  . furosemide (LASIX) 20 MG tablet TAKE 1 TABLET BY MOUTH EVERY DAY 30 tablet 0  . glucosamine-chondroitin 500-400 MG tablet Take 1 tablet by mouth daily.     . hydrALAZINE (APRESOLINE) 25 MG tablet TAKE 1 TABLET BY MOUTH TWICE DAILY 60 tablet 5  .  ibuprofen (ADVIL,MOTRIN) 200 MG tablet Take 200 mg by mouth 2 (two) times daily as needed.    . labetalol (NORMODYNE) 200 MG tablet TAKE TWO TABLETS BY MOUTH TWICE DAILY 360 tablet 1  . losartan (COZAAR) 100 MG tablet TAKE 1 TABLET EVERY DAY 90 tablet 0  . magnesium 30 MG tablet Take 30 mg by mouth daily.    . Multiple Vitamins-Minerals (MULTIVITAMIN WITH MINERALS) tablet Take 1 tablet by mouth daily.      Marland Kitchen oseltamivir (TAMIFLU) 75 MG capsule Take 1 capsule (75 mg total) by mouth 2 (two) times daily. 10 capsule 0  . sertraline (ZOLOFT) 100 MG tablet Take 100 mg by mouth daily.     . temazepam (RESTORIL) 30 MG capsule Take 30 mg by mouth at bedtime as needed.       No current facility-administered medications on file prior to visit.

## 2016-02-08 ENCOUNTER — Other Ambulatory Visit: Payer: Self-pay | Admitting: Pulmonary Disease

## 2016-02-22 ENCOUNTER — Other Ambulatory Visit: Payer: Self-pay | Admitting: Pulmonary Disease

## 2016-02-25 ENCOUNTER — Telehealth: Payer: Self-pay | Admitting: Pulmonary Disease

## 2016-02-25 MED ORDER — DILTIAZEM HCL ER COATED BEADS 360 MG PO TB24: 360.0000 mg | ORAL_TABLET | Freq: Every day | ORAL | 3 refills | Status: DC

## 2016-02-25 NOTE — Telephone Encounter (Signed)
Per SN---  Pt will need to restart the cardizem 360 mg  1 daily.  Pt stated that she does not have any of this medication and would like to do a 90 day supply.  3 refills. This has been sent to her pharmacy.

## 2016-02-25 NOTE — Telephone Encounter (Signed)
Pt states that her BP has been running high since stopping Cardizem. Pt c/o a few HA.  Recent readings have been:  194/94 204/120 Pt wanting to know if she needs to be back on the Cardizem.   FYI to Dr Lenna Gilford: Pt states that she recently saw the Dermatologist and they biopsied the rash on leg and her scalp. 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 Currently treating areas with Fluocinonide 0.05% (cream and Solution) and Fluorouracil 5% cream   Please advise Dr Lenna Gilford. Thanks.

## 2016-03-06 ENCOUNTER — Telehealth: Payer: Self-pay | Admitting: Pulmonary Disease

## 2016-03-06 MED ORDER — DILTIAZEM HCL ER COATED BEADS 360 MG PO CP24: 360.0000 mg | ORAL_CAPSULE | Freq: Every day | ORAL | 3 refills | Status: DC

## 2016-03-06 NOTE — Telephone Encounter (Signed)
SN please advise if we can change the cardizem la 360 mg  To the cardizem cd 360 mg.  The cardizem la is needing PA done and the cardizem cd is covered.  Please advise since the pt is out of this medication. thanks

## 2016-03-06 NOTE — Telephone Encounter (Signed)
[facility] ([phone]) called stating the pt is out of medication. Want to know if the pt can be switched to Cardizem CD 360 mg Cap. Instead of the Cardizem LA 360 mg. Tab. Because the capsules are covered under the insurance without any prior approval.Erica R Lovena Le

## 2016-03-06 NOTE — Telephone Encounter (Signed)
Per SN--  Ok to change to the cardizem to the cd.  This has been done and nothing further is needed. I called and lmom to make the pt aware. Nothing further is needed.

## 2016-03-06 NOTE — Telephone Encounter (Signed)
Called the pharmacy and the diltiazem will need PA done.  Called [phone] and they are going to fax over the PA form.  Will hold in my box to follow up on form this afternoon.

## 2016-03-12 ENCOUNTER — Ambulatory Visit (AMBULATORY_SURGERY_CENTER): Payer: Self-pay | Admitting: *Deleted

## 2016-03-12 VITALS — Ht 66.0 in | Wt 179.0 lb

## 2016-03-12 DIAGNOSIS — Z1211 Encounter for screening for malignant neoplasm of colon: Secondary | ICD-10-CM

## 2016-03-12 MED ORDER — NA SULFATE-K SULFATE-MG SULF 17.5-3.13-1.6 GM/177ML PO SOLN: ORAL | 0 refills | Status: DC

## 2016-03-12 NOTE — Progress Notes (Signed)
No egg or soy allergy  No anesthesia or intubation problems per pt  No diet medications taken  30% coupon given

## 2016-03-23 ENCOUNTER — Telehealth: Payer: Self-pay | Admitting: *Deleted

## 2016-03-23 ENCOUNTER — Other Ambulatory Visit: Payer: Self-pay | Admitting: Pulmonary Disease

## 2016-03-23 ENCOUNTER — Telehealth: Payer: Self-pay | Admitting: Gastroenterology

## 2016-03-23 NOTE — Telephone Encounter (Signed)
ok 

## 2016-03-23 NOTE — Telephone Encounter (Signed)
Patient called and has a fever of 100.8. States very congested and coughing, headache, body aches. Patient to cancel procedure for tomorrow due to illness. Will call us when she is better, although her father lives in [place] and is very ill.  Will send Dr. Delane Ginger a note regarding this information.Marland Kitchen

## 2016-03-24 ENCOUNTER — Encounter: Payer: BC Managed Care – PPO | Admitting: Gastroenterology

## 2016-03-28 ENCOUNTER — Other Ambulatory Visit: Payer: Self-pay | Admitting: Pulmonary Disease

## 2016-04-26 ENCOUNTER — Other Ambulatory Visit: Payer: Self-pay | Admitting: Pulmonary Disease

## 2016-04-28 ENCOUNTER — Other Ambulatory Visit: Payer: Self-pay | Admitting: Pulmonary Disease

## 2016-05-05 ENCOUNTER — Other Ambulatory Visit: Payer: Self-pay | Admitting: Pulmonary Disease

## 2016-05-07 ENCOUNTER — Ambulatory Visit (AMBULATORY_SURGERY_CENTER): Payer: BC Managed Care – PPO | Admitting: Gastroenterology

## 2016-05-07 ENCOUNTER — Encounter: Payer: Self-pay | Admitting: Gastroenterology

## 2016-05-07 VITALS — BP 135/75 | HR 63 | Temp 97.1°F | Resp 12 | Ht 66.0 in | Wt 179.0 lb

## 2016-05-07 DIAGNOSIS — D123 Benign neoplasm of transverse colon: Secondary | ICD-10-CM | POA: Diagnosis not present

## 2016-05-07 DIAGNOSIS — D129 Benign neoplasm of anus and anal canal: Secondary | ICD-10-CM

## 2016-05-07 DIAGNOSIS — K621 Rectal polyp: Secondary | ICD-10-CM

## 2016-05-07 DIAGNOSIS — Z1211 Encounter for screening for malignant neoplasm of colon: Secondary | ICD-10-CM

## 2016-05-07 DIAGNOSIS — Z1212 Encounter for screening for malignant neoplasm of rectum: Secondary | ICD-10-CM

## 2016-05-07 DIAGNOSIS — D128 Benign neoplasm of rectum: Secondary | ICD-10-CM

## 2016-05-07 MED ORDER — SODIUM CHLORIDE 0.9 % IV SOLN: 500.0000 mL | INTRAVENOUS | Status: AC

## 2016-05-07 NOTE — Progress Notes (Signed)
A/ox3 pleased with MAC, report to Jane RN 

## 2016-05-07 NOTE — Patient Instructions (Signed)
YOU HAD AN ENDOSCOPIC PROCEDURE TODAY AT [facility]:   Refer to the procedure report that was given to you for any specific questions about what was found during the examination.  If the procedure report does not answer your questions, please call your gastroenterologist to clarify.  If you requested that your care partner not be given the details of your procedure findings, then the procedure report has been included in a sealed envelope for you to review at your convenience later.  YOU SHOULD EXPECT: Some feelings of bloating in the abdomen. Passage of more gas than usual.  Walking can help get rid of the air that was put into your GI tract during the procedure and reduce the bloating. If you had a lower endoscopy (such as a colonoscopy or flexible sigmoidoscopy) you may notice spotting of blood in your stool or on the toilet paper. If you underwent a bowel prep for your procedure, you may not have a normal bowel movement for a few days.  Please Note:  You might notice some irritation and congestion in your nose or some drainage.  This is from the oxygen used during your procedure.  There is no need for concern and it should clear up in a day or so.  SYMPTOMS TO REPORT IMMEDIATELY:   Following lower endoscopy (colonoscopy or flexible sigmoidoscopy):  Excessive amounts of blood in the stool  Significant tenderness or worsening of abdominal pains  Swelling of the abdomen that is new, acute  Fever of 100F or higher   Following upper endoscopy (EGD)  Vomiting of blood or coffee ground material  New chest pain or pain under the shoulder blades  Painful or persistently difficult swallowing  New shortness of breath  Fever of 100F or higher  Black, tarry-looking stools  For urgent or emergent issues, a gastroenterologist can be reached at any hour by calling [phone].   DIET:  We do recommend a small meal at first, but then you may proceed to your regular diet.  Drink  plenty of fluids but you should avoid alcoholic beverages for 24 hours.  ACTIVITY:  You should plan to take it easy for the rest of today and you should NOT DRIVE or use heavy machinery until tomorrow (because of the sedation medicines used during the test).    FOLLOW UP: Our staff will call the number listed on your records the next business day following your procedure to check on you and address any questions or concerns that you may have regarding the information given to you following your procedure. If we do not reach you, we will leave a message.  However, if you are feeling well and you are not experiencing any problems, there is no need to return our call.  We will assume that you have returned to your regular daily activities without incident.  If any biopsies were taken you will be contacted by phone or by letter within the next 1-3 weeks.  Please call us at [phone] if you have not heard about the biopsies in 3 weeks.    SIGNATURES/CONFIDENTIALITY: You and/or your care partner have signed paperwork which will be entered into your electronic medical record.  These signatures attest to the fact that that the information above on your After Visit Summary has been reviewed and is understood.  Full responsibility of the confidentiality of this discharge information lies with you and/or your care-partner.  Polyp, diverticulosis, and hemorrhoid information given.  No aspirin or other  anti inflammatory meds for 2 weeks.

## 2016-05-07 NOTE — Op Note (Signed)
[facility] Patient Name: Monique Weber Procedure Date: 05/07/2016 2:01 PM MRN: [id number] Endoscopist: Mauri Pole , MD Age: 60 Referring MD:  Date of Birth: August 23, 1955 Gender: Female Account #: [id number] Procedure:                Colonoscopy Indications:              Screening for colorectal malignant neoplasm, Last                            colonoscopy: 2007 Medicines:                Monitored Anesthesia Care Procedure:                Pre-Anesthesia Assessment:                           - Prior to the procedure, a History and Physical                            was performed, and patient medications and                            allergies were reviewed. The patient's tolerance of                            previous anesthesia was also reviewed. The risks                            and benefits of the procedure and the sedation                            options and risks were discussed with the patient.                            All questions were answered, and informed consent                            was obtained. Prior Anticoagulants: The patient has                            taken no previous anticoagulant or antiplatelet                            agents. ASA Grade Assessment: II - A patient with                            mild systemic disease. After reviewing the risks                            and benefits, the patient was deemed in                            satisfactory condition to undergo the procedure.  After obtaining informed consent, the colonoscope                            was passed under direct vision. Throughout the                            procedure, the patient's blood pressure, pulse, and                            oxygen saturations were monitored continuously. The                            Model CF-HQ190L [id number]) scope was introduced                            through the anus and advanced to  the the terminal                            ileum, with identification of the appendiceal                            orifice and IC valve. The colonoscopy was performed                            without difficulty. The patient tolerated the                            procedure well. The quality of the bowel                            preparation was excellent. The terminal ileum,                            ileocecal valve, appendiceal orifice, and rectum                            were photographed. Scope In: 2:08:54 PM Scope Out: 2:42:05 PM Scope Withdrawal Time: 0 hours 26 minutes 10 seconds  Total Procedure Duration: 0 hours 33 minutes 11 seconds  Findings:                 The perianal and digital rectal examinations were                            normal.                           Three sessile polyps were found in the transverse                            colon. The polyps were 4 to 9 mm in size. These                            polyps were removed with a cold snare. Resection  and retrieval were complete. Coagulation for                            hemostasis of bleeding caused by the procedure,                            polypectomy at site of one of the larger polyps,                            using monopolar probe was unsuccessful. To prevent                            bleeding after the polypectomy, three hemostatic                            clips were successfully placed (MR conditional).                            There was no bleeding at the end of the procedure.                           Two sessile polyps were found in the rectum. The                            polyps were 1 to 2 mm in size. These polyps were                            removed with a cold biopsy forceps. Resection and                            retrieval were complete.                           Multiple small-mouthed diverticula were found in                            the  entire colon.                           Non-bleeding internal hemorrhoids were found during                            retroflexion. The hemorrhoids were small. Complications:            No immediate complications. Estimated Blood Loss:     Estimated blood loss was minimal. Impression:               - Three 4 to 9 mm polyps in the transverse colon,                            removed with a cold snare. Resected and retrieved.                            Treatment not successful. Treated with a monopolar  probe. Clips (MR conditional) were placed.                           - Two 1 to 2 mm polyps in the rectum, removed with                            a cold biopsy forceps. Resected and retrieved.                           - Diverticulosis in the entire examined colon.                           - Non-bleeding internal hemorrhoids. Recommendation:           - Patient has a contact number available for                            emergencies. The signs and symptoms of potential                            delayed complications were discussed with the                            patient. Return to normal activities tomorrow.                            Written discharge instructions were provided to the                            patient.                           - Resume previous diet.                           - Continue present medications.                           - Await pathology results.                           - No aspirin, ibuprofen, naproxen, or other                            non-steroidal anti-inflammatory drugs for 2 weeks.                           - Repeat colonoscopy in 3 - 5 years for                            surveillance based on pathology results. Mauri Pole, MD 05/07/2016 2:48:36 PM This report has been signed electronically.

## 2016-05-07 NOTE — Progress Notes (Signed)
Called to room to assist during endoscopic procedure.  Patient ID and intended procedure confirmed with present staff. Received instructions for my participation in the procedure from the performing physician.  

## 2016-05-08 ENCOUNTER — Telehealth: Payer: Self-pay | Admitting: *Deleted

## 2016-05-08 NOTE — Telephone Encounter (Signed)
  Follow up Call-  Call back number 05/07/2016  Post procedure Call Back phone  # [phone]  Permission to leave phone message Yes  Some recent data might be hidden     Patient questions:  Do you have a fever, pain , or abdominal swelling? No. Pain Score  0 *  Have you tolerated food without any problems? Yes.    Have you been able to return to your normal activities? Yes.    Do you have any questions about your discharge instructions: Diet   No. Medications  No. Follow up visit  No.  Do you have questions or concerns about your Care? No.  Actions: * If pain score is 4 or above: No action needed, pain <4.

## 2016-05-13 ENCOUNTER — Telehealth: Payer: Self-pay | Admitting: Pulmonary Disease

## 2016-05-13 NOTE — Telephone Encounter (Signed)
Spoke with Monique Weber. States that she was diagnosed with Hep B. Reports that she was told that taking Crestor 40mg  could cause a false positive for Hep B. Her Rheumatologist is referring her to the Hepatitis clinic at [facility] for further testing. Monique Weber wants to speak to SN about this. She is aware that SN is not back in the office until Monday 05/18/16.  SN - please advise. Thanks.

## 2016-05-19 ENCOUNTER — Encounter: Payer: Self-pay | Admitting: Gastroenterology

## 2016-05-21 NOTE — Telephone Encounter (Signed)
SN please advise. Thanks.  

## 2016-05-22 NOTE — Telephone Encounter (Signed)
Per SN: I called her 05/20/2016.  Sign off please  Will do so

## 2016-05-26 ENCOUNTER — Telehealth: Payer: Self-pay | Admitting: Gastroenterology

## 2016-05-26 NOTE — Telephone Encounter (Signed)
Discussed the recall colonoscopy. Copy of her biopsy report mailed to her per her request.

## 2016-06-02 ENCOUNTER — Telehealth: Payer: Self-pay | Admitting: Pulmonary Disease

## 2016-06-02 NOTE — Telephone Encounter (Signed)
Monique Weber- In my opinion the major autoimmune flair needs to be managed by a rheumatologist and we don't have too many options here in [place] given your experience at the [place] [facility]... I suggest that we set you up to see a Rheumatologist at [facility] for their input & expertise... Let me know if you want to proceed & we will call to set this up... Scott   E-Mail from Pt >>      Hi there,      [place] Just heard that test results for HepB show false positive. 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 I am actually immune to it and do not require vaccine. Most importantly, I do not have HepB! Still need to work on autoimmune issues.  Happy day in my world! ????????      I was not pleased with [facility]. 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  I have been diagnosed with ankylosing spondylitis by Dr. Trudie Reed, the rheumatologist. This diagnosis explains many symptoms and associated incidents of iritis, inflamed rib "costocondritis?" Sacroilitis, carpal tunnel syndrome and joint pain/osteoarthritis.        Appt cancelled with the gastroenterologist at [facility] since I do not have liver problems.  Who do I need to follow up with to get some treatment for this major autoimmune flare? I think a round of prednisone to jump start my immune system is in order. However, I am not a physician, just an informed patient! ??????      Thank you for taking time to offer your advice. I turn to you, as my treating physician, for obvious reasons.          With respect and gratitude,       Monique Weber, [place]

## 2016-06-21 ENCOUNTER — Other Ambulatory Visit: Payer: Self-pay | Admitting: Pulmonary Disease

## 2016-07-15 ENCOUNTER — Other Ambulatory Visit: Payer: Self-pay | Admitting: Pulmonary Disease

## 2016-07-17 IMAGING — CR DG STERNUM 2+V
2 series · 2 of 2 positions shown · non-contrast
Comparison: None.

CLINICAL DATA: Fall, mid chest pain within a lesion.

EXAM:
STERNUM - 2+ VIEW

[lateral]
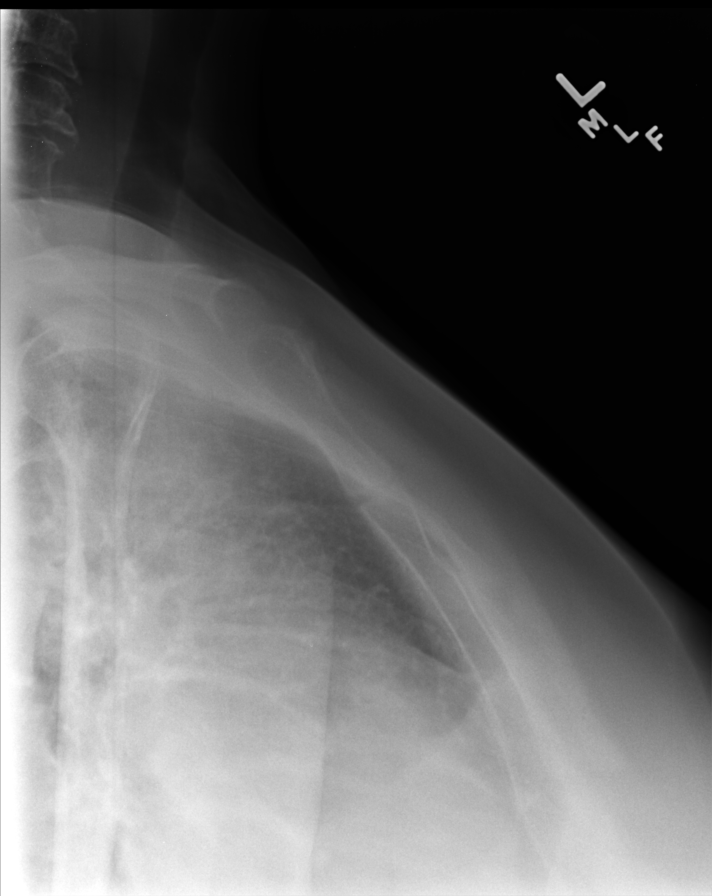

[PA]
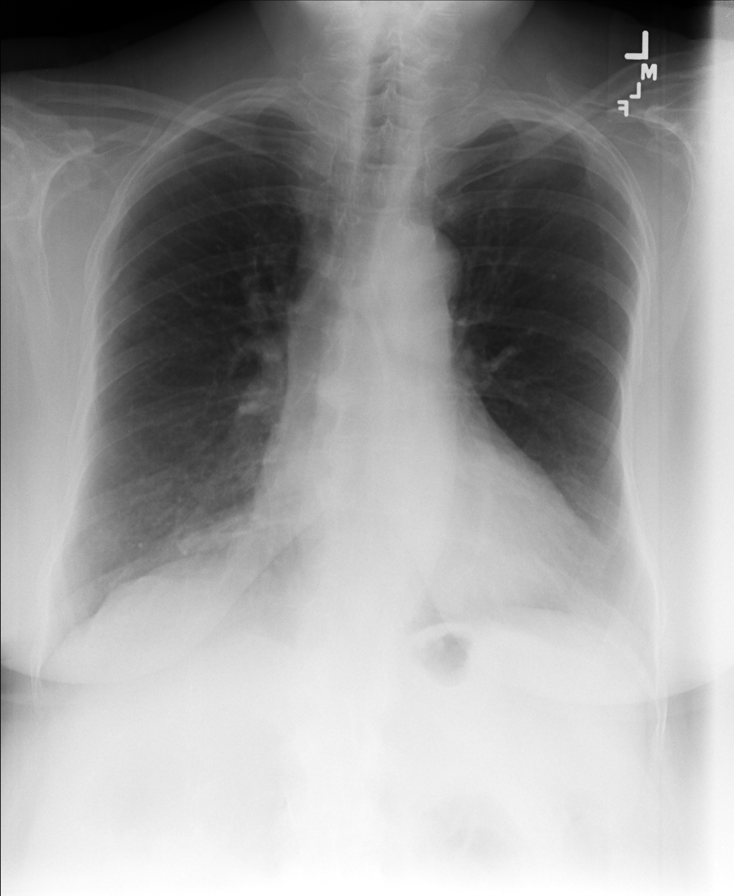

[2 of 2 positions shown; findings below may reference images not displayed]

FINDINGS: There is a slight deformity along the anterior cortical margin of
the sternum, upper body region, suspicious for minimally displaced
fracture. Remainder of the sternum appears intact and well aligned.
IMPRESSION: Probable slightly displaced sternal fracture.

## 2016-07-23 ENCOUNTER — Other Ambulatory Visit: Payer: Self-pay | Admitting: Pulmonary Disease

## 2016-07-28 ENCOUNTER — Other Ambulatory Visit: Payer: Self-pay | Admitting: Pulmonary Disease

## 2016-08-03 ENCOUNTER — Other Ambulatory Visit: Payer: Self-pay | Admitting: Pulmonary Disease

## 2016-08-08 ENCOUNTER — Other Ambulatory Visit: Payer: Self-pay | Admitting: Pulmonary Disease

## 2016-09-01 ENCOUNTER — Other Ambulatory Visit: Payer: Self-pay | Admitting: Pulmonary Disease

## 2016-09-26 ENCOUNTER — Other Ambulatory Visit: Payer: Self-pay | Admitting: Pulmonary Disease

## 2016-10-07 ENCOUNTER — Other Ambulatory Visit: Payer: Self-pay | Admitting: Family Medicine

## 2016-10-14 ENCOUNTER — Other Ambulatory Visit: Payer: Self-pay | Admitting: Pulmonary Disease

## 2016-10-19 ENCOUNTER — Other Ambulatory Visit: Payer: Self-pay | Admitting: Pulmonary Disease

## 2016-10-23 ENCOUNTER — Other Ambulatory Visit: Payer: Self-pay | Admitting: Pulmonary Disease

## 2016-11-24 ENCOUNTER — Other Ambulatory Visit: Payer: Self-pay | Admitting: Pulmonary Disease

## 2017-01-12 ENCOUNTER — Other Ambulatory Visit: Payer: Self-pay | Admitting: Pulmonary Disease

## 2017-01-24 ENCOUNTER — Other Ambulatory Visit: Payer: Self-pay | Admitting: Pulmonary Disease

## 2017-03-02 ENCOUNTER — Other Ambulatory Visit: Payer: Self-pay | Admitting: Pulmonary Disease

## 2017-06-13 ENCOUNTER — Other Ambulatory Visit: Payer: Self-pay | Admitting: Pulmonary Disease

## 2017-07-11 ENCOUNTER — Other Ambulatory Visit: Payer: Self-pay | Admitting: Pulmonary Disease

## 2017-07-23 ENCOUNTER — Ambulatory Visit: Payer: BC Managed Care – PPO | Admitting: Cardiovascular Disease

## 2017-09-07 ENCOUNTER — Ambulatory Visit: Payer: BC Managed Care – PPO | Admitting: Cardiovascular Disease

## 2017-09-22 ENCOUNTER — Other Ambulatory Visit: Payer: Self-pay | Admitting: Pulmonary Disease

## 2017-10-05 ENCOUNTER — Encounter: Payer: Self-pay | Admitting: Family Medicine

## 2017-11-10 ENCOUNTER — Ambulatory Visit: Payer: BC Managed Care – PPO | Admitting: Cardiovascular Disease

## 2017-11-10 ENCOUNTER — Encounter

## 2018-04-01 ENCOUNTER — Other Ambulatory Visit: Payer: Self-pay | Admitting: Pulmonary Disease

## 2019-04-10 ENCOUNTER — Encounter: Payer: Self-pay | Admitting: Gastroenterology

## 2020-04-18 ENCOUNTER — Ambulatory Visit: Payer: Medicare PPO | Admitting: Pulmonary Disease

## 2020-04-18 ENCOUNTER — Other Ambulatory Visit: Payer: Self-pay

## 2020-04-18 ENCOUNTER — Encounter: Payer: Self-pay | Admitting: Pulmonary Disease

## 2020-04-18 ENCOUNTER — Telehealth: Payer: Self-pay | Admitting: Pulmonary Disease

## 2020-04-18 VITALS — BP 126/68 | HR 68 | Temp 97.8°F | Ht 66.0 in | Wt 212.0 lb

## 2020-04-18 DIAGNOSIS — Z01818 Encounter for other preprocedural examination: Secondary | ICD-10-CM

## 2020-04-18 DIAGNOSIS — J4520 Mild intermittent asthma, uncomplicated: Secondary | ICD-10-CM | POA: Diagnosis not present

## 2020-04-18 NOTE — Patient Instructions (Addendum)
Nice to meet you!  No changes today - continue [place]  Ok to proceed with surgery Monday. Best of luck!  Return to clinic in 1 year with Dr. Silas Flood
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
## 2020-04-18 NOTE — Telephone Encounter (Signed)
Spoke with Monique Weber that we do not have a surgery clearance for on this pt  She states it was faxed 03/25/20  She will refax now to the up front fax- will await

## 2020-04-19 NOTE — Telephone Encounter (Signed)
Called and spoke with Pam. I advised her that we still have no received the clearance form but the patient was seen by Dr. Silas Flood yesterday and he cleared her for surgery. She was requested that I send a copy of the office visit notes to her attention. Verified the fax number.  Nothing further needed at time of call.

## 2020-04-19 NOTE — Telephone Encounter (Signed)
Pam checking on surgical clearance form. Will be available until 1:00 pm. Pam phone number is [phone].

## 2020-04-19 NOTE — Telephone Encounter (Signed)
Checked Dr. Kavin Leech folders in the [facility] and also up front and did not find any surgical clearance paperwork on pt.  Dr. Silas Flood, please advise if you might have this up in your office? If not, we will have [facility] fax form again.

## 2020-04-19 NOTE — Telephone Encounter (Signed)
Pam called in states she has sent surgical clearance over twice through fax and has not received any update. Pt is having surgery on Monday Dec 13th . Please advise

## 2021-04-15 ENCOUNTER — Ambulatory Visit: Payer: Medicare PPO | Admitting: Pulmonary Disease

## 2021-09-09 ENCOUNTER — Ambulatory Visit: Payer: Medicare PPO

## 2021-09-23 ENCOUNTER — Ambulatory Visit: Payer: Medicare PPO

## 2021-10-03 ENCOUNTER — Ambulatory Visit: Payer: Self-pay | Admitting: Physical Therapy

## 2021-11-18 ENCOUNTER — Ambulatory Visit: Payer: Medicare PPO | Admitting: Physical Therapy

## 2022-03-11 ENCOUNTER — Encounter: Payer: Self-pay | Admitting: Pulmonary Disease

## 2022-03-11 ENCOUNTER — Ambulatory Visit: Payer: Medicare PPO | Admitting: Pulmonary Disease

## 2022-03-11 VITALS — BP 124/64 | HR 71 | Ht 64.0 in | Wt 217.6 lb

## 2022-03-11 DIAGNOSIS — J4520 Mild intermittent asthma, uncomplicated: Secondary | ICD-10-CM

## 2022-03-11 DIAGNOSIS — R0609 Other forms of dyspnea: Secondary | ICD-10-CM | POA: Diagnosis not present

## 2022-03-11 MED ORDER — TRELEGY ELLIPTA 200-62.5-25 MCG/ACT IN AEPB: 1.0000 | INHALATION_SPRAY | Freq: Every day | RESPIRATORY_TRACT | 0 refills | Status: DC

## 2022-03-11 MED ORDER — TRELEGY ELLIPTA 200-62.5-25 MCG/ACT IN AEPB: 1.0000 | INHALATION_SPRAY | Freq: Every day | RESPIRATORY_TRACT | 3 refills | Status: DC

## 2022-03-11 NOTE — Patient Instructions (Signed)
Nice to see you again  Since the albuterol is helping some, lets increase your inhaler therapy to Trelegy.  This adds an additional bronchodilator but hopefully will help with your symptoms.  Use 1 puff once a day, rinse your mouth out after every use.  Stop the Wixela once you start the Trelegy.  I provided samples and sent a prescription today.  If it is too expensive please let me know.  Return to clinic in 3 months or sooner as needed with Dr. Silas Flood
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
## 2022-04-05 ENCOUNTER — Encounter: Payer: Self-pay | Admitting: Pulmonary Disease

## 2022-04-06 MED ORDER — ANORO ELLIPTA 62.5-25 MCG/ACT IN AEPB: 1.0000 | INHALATION_SPRAY | Freq: Every day | RESPIRATORY_TRACT | 5 refills | Status: DC

## 2022-04-06 NOTE — Telephone Encounter (Signed)
Mychart message sent by pt: Monique Weber [facility] (supporting Lanier Clam, MD)12 hours ago (9:22 PM)    Hello there,   My first few weeks on Trelegy went smoothly. My breathing improved and I seldom needed to use my albuterol.   Within the last two weeks my eyesight began to blur. I had headaches in the morning, heavy congestion in my chest which is relieved an hour or so after I use the Trelegy. I've experienced itching and gained 4 lbs.    My bp has been high and I'm experiencing mild chest pains and tachycardia. [name].     I had surgery for narrow angle glaucoma about 10 years ago. Can we find an alternative?  I can drive to [facility] to see you and my ophthalmologist but would like to receive a reply or schedule a telemed or in person visit.    It's been a little frightening. I have confidence in you and appreciate your support and care.   Take care, Monique Weber  There is also an image of recent BP that pt took. Dr. Silas Flood, please advise on this for pt.

## 2022-04-08 MED ORDER — ANORO ELLIPTA 62.5-25 MCG/ACT IN AEPB: 1.0000 | INHALATION_SPRAY | Freq: Every day | RESPIRATORY_TRACT | 5 refills | Status: DC

## 2022-04-08 NOTE — Addendum Note (Signed)
Addended by: Claudette Head A on: 04/08/2022 01:54 PM   Modules accepted: Orders

## 2022-04-09 ENCOUNTER — Other Ambulatory Visit (HOSPITAL_COMMUNITY): Payer: Self-pay

## 2022-04-09 NOTE — Telephone Encounter (Signed)
Pharmacy, please advise what the covered alternatives are for Anoro. Thanks.

## 2022-04-09 NOTE — Telephone Encounter (Signed)
Insurance will cover [employer] and [employer]. Stiolto is the cheaper of the two at this time.

## 2022-04-14 ENCOUNTER — Other Ambulatory Visit: Payer: Self-pay

## 2022-04-14 MED ORDER — STIOLTO RESPIMAT 2.5-2.5 MCG/ACT IN AERS: 2.0000 | INHALATION_SPRAY | Freq: Every day | RESPIRATORY_TRACT | 2 refills | Status: DC

## 2022-04-14 NOTE — Telephone Encounter (Signed)
Please send Stiolto 2.5-2.5, 2 puff once daily

## 2022-04-14 NOTE — Telephone Encounter (Signed)
Dr. Silas Flood, please advise on pts message regarding insurance denying the Anoro. Pharmacy staff states the [place] and [place] are covered with Stiolto being the cheaper of the two. Thanks.

## 2022-04-15 ENCOUNTER — Encounter: Payer: Self-pay | Admitting: Pulmonary Disease

## 2022-04-16 NOTE — Telephone Encounter (Signed)
Please advise patient that we will let Dr. Silas Flood know for now we will definitely continue on [place] .  Unfortunately since she is unable to take LAMA medications due to the side effect profile we can put those on her allergy list so please put Spiriva on her allergy list so going forward we will avoid these type of medications Please thank her for bring that to our attention we will most definitely let Dr. Silas Flood know so if any additional recommendations are made he will definitely reach out to her next week

## 2022-04-16 NOTE — Telephone Encounter (Addendum)
Good morning Monique Weber, This patient is a patient of Dr. Silas Flood and he will be out of the office until May 18, 2021 on vacation.  This patient was on [place] for her asthma, she was changed to Trelegy because she had worsening sob.  She had a lot of the side effects that people experience.  She was then put on Stiolto, she did the research and found that the medications has the same side effects.  She is upset that this was prescribed given that she had already side effects with the Trelegy and wonders why the Stiolto was prescribed given that information.  She has gone back to using her [place].  I offered her an OV to sort this out, however, she has not scheduled an OV.  She requested that we message you to review her record for further recommendations.  Please advise.  Thank you.

## 2022-06-15 ENCOUNTER — Ambulatory Visit: Payer: Medicare PPO | Admitting: Pulmonary Disease

## 2022-07-07 ENCOUNTER — Telehealth: Payer: Self-pay | Admitting: Pulmonary Disease

## 2022-07-07 NOTE — Telephone Encounter (Signed)
Video visit is ok - thanks

## 2022-07-07 NOTE — Telephone Encounter (Signed)
Spoke with patient she advises she is 2hrs away and wants to know if her appointment can be changed to a my chart video visit tomorrow.  Dr. Silas Flood can you please advise

## 2022-07-07 NOTE — Telephone Encounter (Signed)
Appointment has been changed to video visit tomorrow. Okay 'd by Dr. Silas Flood. Patient is aware. Nothing further needed.

## 2022-07-07 NOTE — Telephone Encounter (Signed)
Patient would like to do a mychart video visit on 07/08/2022. Patient phone number is [phone] and [phone].

## 2022-07-08 ENCOUNTER — Telehealth (INDEPENDENT_AMBULATORY_CARE_PROVIDER_SITE_OTHER): Payer: Medicare PPO | Admitting: Pulmonary Disease

## 2022-07-08 ENCOUNTER — Encounter: Payer: Self-pay | Admitting: Pulmonary Disease

## 2022-07-08 DIAGNOSIS — G4734 Idiopathic sleep related nonobstructive alveolar hypoventilation: Secondary | ICD-10-CM

## 2022-07-08 NOTE — Progress Notes (Signed)
Virtual Visit via Telephone Note  I connected with Monique Weber on 07/08/22 at  2:15 PM EST by telephone and verified that I am speaking with the correct person using two identifiers.  Location: Patient: Home Provider: [facility] - [id number], [place], [place], [place] [address]   I discussed the limitations, risks, security and privacy concerns of performing an evaluation and management service by telephone and the availability of in person appointments. I also discussed with the patient that there may be a patient responsible charge related to this service. The patient expressed understanding and agreed to proceed.  Patient consented to consult via telephone: Yes People present and their role in pt care: Pt     History of Present Illness:  67 y.o. woman with a history of asthma whom we are seeing in follow-up for asthma and shortness of breath.  At last visit, symptoms seem not well-controlled.  On Advair/Wixela.  Mid dose.  Escalate to Trelegy.  For a week her breathing markedly improved.  Then randomly and suddenly she had a lot of chest congestion and shortness of breath as well as vision changes concerning for glaucoma flare.  Stop Trelegy.  Recommend Stiolto.  She is not due for refills given similar side effects reported the package insert.  Explained at length that both inhaled steroids as well as long-acting muscarinic agents can contribute to worsening glaucoma.  Suspect that the addition of both ICS and LAMA contributed given she had tolerated ICS.  Since her previous ICS/LABA therapy was unsuccessful, that prompted my recommendation to try LAMA/LABA therapy given her initial improvement with additional bronchodilation as well as her reported improvement with short acting bronchodilators, albuterol.  She expressed understanding.  She wishes however not to try something different as she is worried about her eyes.  We discussed continuing her [place] given her  breathing is okay for now.  Curiously, she describes wildly fluctuating oxygen saturations from 60-70s to mid 90s.  Seems to improved with exertion which begs a question of atelectasis and shunt physiology.  However, given this wild swings I do query whether these readings are accurate.  She is very resistant to any daytime oxygen and does not want any additional testing to evaluate for this.   Chief complaint:   DOE  Observations/Objective:  Social History   Tobacco Use  Smoking Status Former   Packs/day: 0.50   Years: 20.00   Total pack years: 10.00   Types: Cigarettes  Smokeless Tobacco Never  Tobacco Comments   off and on since 1992   Immunization History  Administered Date(s) Administered   Influenza Split 02/20/2011, 02/01/2012, 02/20/2013   Influenza Whole 02/19/2009, 03/26/2010   Influenza,inj,Quad PF,6+ Mos 02/09/2015   Moderna Covid-19 Vaccine Bivalent Booster 16yr & up 02/10/2021   Moderna Sars-Covid-2 Vaccination 07/18/2019, 08/18/2019, 03/20/2020, 04/18/2020, 08/22/2020   Pneumococcal Conjugate-13 12/21/2013   Tdap 05/12/2011      Assessment and Plan:  Possible hypoxemia: Curiously, she describes wildly fluctuating oxygen saturations from 60-70s to mid 90s.  Seems to improved with exertion which begs a question of atelectasis and shunt physiology.  However, given this wild swings I do query whether these readings are accurate.  She is very resistant to any daytime oxygen and does not want any additional testing to evaluate for this.  Will evaluate with overnight oximetry and if oxygen is low she will need to be prescribed nocturnal oxygen, she does already have a oxygen concentrator at home which she  does not use.  I did recommend an exertional or walk test to evaluate for exertional hypoxemia which she declines today.  Asthma: Historically well controlled on ICS/LABA.  On mid dose Wixela maintenance therapy.  However worsening shortness of breath in the setting  of weight gain.  Possibly just related to weight gain versus worsening non-Th2 asthma.  Endorses benefit with albuterol use, which encourages additional bronchodilator therapy.  Escalated Trelegy at last visit with interval improvement in dyspnea  on exertion but then subsequent development of sudden onset chest congestion as well as vision changes so we stopped Trelegy.  To continue Wixela for now.  Offered Stiolto.  Suspect ICS and LAMA combination led to worsening eye/vision changes and would be okay with 1 in isolation.  However understandably, she wishes to avoid any changes or risk to her eyes.  The justification for addition of LAMA would be her improvement with short acting bronchodilators, but we will hold on this for now.  Dyspnea on exertion: Concern for poorly controlled asthma given improvement with albuterol .  Also concern for weight gain.  Encouraged efforts at ongoing weight gain.  Suspect would benefit from referral to cardiology.  We did discuss this today but no referral was sent after shared decision making.  Follow Up Instructions:  Return in about 6 months (around 01/06/2023).   I discussed the assessment and treatment plan with the patient. The patient was provided an opportunity to ask questions and all were answered. The patient agreed with the plan and demonstrated an understanding of the instructions.   The patient was advised to call back or seek an in-person evaluation if the symptoms worsen or if the condition fails to improve as anticipated.  I provided 21 minutes of non-face-to-face time during this encounter.   Lanier Clam, MD

## 2022-07-08 NOTE — Patient Instructions (Signed)
Continue Wixela  No changes to inhalers  Will do over the oximetry test to assess for nocturnal oxygen need  We should consider in the future walk test to evaluate for daytime oxygen need but I understand holding off on this for now.

## 2022-07-08 NOTE — Addendum Note (Signed)
Addended by: Retia Passe on: 07/08/2022 04:43 PM   Modules accepted: Orders

## 2022-08-18 ENCOUNTER — Telehealth: Payer: Self-pay | Admitting: Pulmonary Disease

## 2022-08-18 NOTE — Telephone Encounter (Signed)
Called patient and went over the results of her over night oxygen results. Dr Judeth Horn states that she needs to wear 3L of oxygen at night. She states that she already has an oxygen concentrator at home and will start to use it for night time use and report back to me with any updates.   She does not want me to put in new order for new concentrator at this time. Will update [facility] note though. Sending ONO results to scan per [facility] request.   Nothing further needed

## 2022-08-26 ENCOUNTER — Encounter: Payer: Self-pay | Admitting: Pulmonary Disease

## 2022-10-19 ENCOUNTER — Telehealth: Payer: Self-pay | Admitting: Pulmonary Disease

## 2022-10-19 NOTE — Telephone Encounter (Signed)
Pt calling in about a oxygen prescription

## 2022-10-23 NOTE — Telephone Encounter (Signed)
Called patient but she did not answer. Left message for her to call back.  

## 2022-10-26 NOTE — Telephone Encounter (Signed)
Patient is returning phone call. Patient phone number is [phone].

## 2022-10-27 NOTE — Telephone Encounter (Signed)
Called and spoke with patient. She stated that she never received an explanation from [facility] on why she needed oxygen at night. She remembers the call from April back in April but she was under the impression that [facility] would have followed up with her by now. Because of this and other unhappy situations, she is requesting to switch providers. I went over the provider switch policy and she verbalized understanding. She wishes to switch to Dr. Maple Hudson.   Dr. Judeth Horn, are you ok with her switching to Dr. Maple Hudson?   Dr. Maple Hudson, are you ok with accepting her as a new patient?

## 2022-10-27 NOTE — Telephone Encounter (Signed)
Sounds like her expectations are not being met. Ok to switch providers.

## 2022-10-27 NOTE — Telephone Encounter (Signed)
Ok to switch 

## 2022-11-17 NOTE — Telephone Encounter (Signed)
Contacted Pt b/c she has wanted to switch providers 3 weeks ago and I reached out to patient to inform her that the switch was okay. Pt is upset bc it has taken [place] 3 weeks to reach out to this and is now seeking a new doctors office.
